# Patient Record
Sex: Female | Born: 1950 | Race: White | Hispanic: No | Marital: Married | Smoking: Never smoker
Health system: Southern US, Community
[De-identification: ages and names within clinical notes are randomized; demographics above are authoritative.]

## PROBLEM LIST (undated history)

## (undated) DIAGNOSIS — I480 Paroxysmal atrial fibrillation: Secondary | ICD-10-CM

## (undated) DIAGNOSIS — R911 Solitary pulmonary nodule: Secondary | ICD-10-CM

## (undated) DIAGNOSIS — E039 Hypothyroidism, unspecified: Secondary | ICD-10-CM

## (undated) DIAGNOSIS — Z8719 Personal history of other diseases of the digestive system: Secondary | ICD-10-CM

## (undated) DIAGNOSIS — M1990 Unspecified osteoarthritis, unspecified site: Secondary | ICD-10-CM

## (undated) DIAGNOSIS — I499 Cardiac arrhythmia, unspecified: Secondary | ICD-10-CM

## (undated) DIAGNOSIS — E785 Hyperlipidemia, unspecified: Secondary | ICD-10-CM

## (undated) DIAGNOSIS — K219 Gastro-esophageal reflux disease without esophagitis: Secondary | ICD-10-CM

## (undated) HISTORY — PX: TONSILLECTOMY: SUR1361

## (undated) HISTORY — PX: KNEE ARTHROSCOPY: SUR90

## (undated) HISTORY — PX: ATRIAL FIBRILLATION ABLATION: SHX5732

## (undated) HISTORY — PX: ANKLE FUSION: SHX881

## (undated) HISTORY — PX: APPENDECTOMY: SHX54

---

## 2003-06-13 ENCOUNTER — Ambulatory Visit (HOSPITAL_COMMUNITY): Admission: RE | Admit: 2003-06-13 | Discharge: 2003-06-13 | Payer: Self-pay | Admitting: Orthopedic Surgery

## 2003-06-13 ENCOUNTER — Ambulatory Visit (HOSPITAL_BASED_OUTPATIENT_CLINIC_OR_DEPARTMENT_OTHER): Admission: RE | Admit: 2003-06-13 | Discharge: 2003-06-13 | Payer: Self-pay | Admitting: Orthopedic Surgery

## 2004-05-16 ENCOUNTER — Other Ambulatory Visit: Admission: RE | Admit: 2004-05-16 | Discharge: 2004-05-16 | Payer: Self-pay | Admitting: *Deleted

## 2015-11-19 DIAGNOSIS — M62830 Muscle spasm of back: Secondary | ICD-10-CM | POA: Diagnosis not present

## 2015-11-19 DIAGNOSIS — M549 Dorsalgia, unspecified: Secondary | ICD-10-CM | POA: Diagnosis not present

## 2015-11-26 DIAGNOSIS — M549 Dorsalgia, unspecified: Secondary | ICD-10-CM | POA: Diagnosis not present

## 2015-11-26 DIAGNOSIS — M4690 Unspecified inflammatory spondylopathy, site unspecified: Secondary | ICD-10-CM | POA: Diagnosis not present

## 2015-12-26 ENCOUNTER — Other Ambulatory Visit (HOSPITAL_COMMUNITY)
Admission: RE | Admit: 2015-12-26 | Discharge: 2015-12-26 | Disposition: A | Discharge status: Home / Self Care | Payer: Medicare Other | Source: Ambulatory Visit | Attending: Internal Medicine | Admitting: Internal Medicine

## 2015-12-26 ENCOUNTER — Other Ambulatory Visit: Payer: Self-pay | Admitting: Registered Nurse

## 2015-12-26 DIAGNOSIS — Z01419 Encounter for gynecological examination (general) (routine) without abnormal findings: Secondary | ICD-10-CM | POA: Diagnosis not present

## 2015-12-28 DIAGNOSIS — Z1211 Encounter for screening for malignant neoplasm of colon: Secondary | ICD-10-CM | POA: Diagnosis not present

## 2015-12-28 DIAGNOSIS — K3189 Other diseases of stomach and duodenum: Secondary | ICD-10-CM | POA: Diagnosis not present

## 2015-12-28 DIAGNOSIS — D127 Benign neoplasm of rectosigmoid junction: Secondary | ICD-10-CM | POA: Diagnosis not present

## 2015-12-28 DIAGNOSIS — K319 Disease of stomach and duodenum, unspecified: Secondary | ICD-10-CM | POA: Diagnosis not present

## 2015-12-31 LAB — CYTOLOGY - PAP

## 2016-02-05 DIAGNOSIS — K210 Gastro-esophageal reflux disease with esophagitis: Secondary | ICD-10-CM | POA: Diagnosis not present

## 2016-02-05 DIAGNOSIS — Z86010 Personal history of colonic polyps: Secondary | ICD-10-CM | POA: Diagnosis not present

## 2016-02-05 DIAGNOSIS — R152 Fecal urgency: Secondary | ICD-10-CM | POA: Diagnosis not present

## 2016-03-27 DIAGNOSIS — E785 Hyperlipidemia, unspecified: Secondary | ICD-10-CM | POA: Diagnosis not present

## 2016-08-22 DIAGNOSIS — M1711 Unilateral primary osteoarthritis, right knee: Secondary | ICD-10-CM | POA: Diagnosis not present

## 2016-08-22 DIAGNOSIS — M1712 Unilateral primary osteoarthritis, left knee: Secondary | ICD-10-CM | POA: Diagnosis not present

## 2016-08-22 DIAGNOSIS — M79671 Pain in right foot: Secondary | ICD-10-CM | POA: Diagnosis not present

## 2016-09-29 DIAGNOSIS — M1712 Unilateral primary osteoarthritis, left knee: Secondary | ICD-10-CM | POA: Diagnosis not present

## 2016-09-29 DIAGNOSIS — M19011 Primary osteoarthritis, right shoulder: Secondary | ICD-10-CM | POA: Diagnosis not present

## 2016-09-29 DIAGNOSIS — M1711 Unilateral primary osteoarthritis, right knee: Secondary | ICD-10-CM | POA: Diagnosis not present

## 2016-10-24 DIAGNOSIS — R05 Cough: Secondary | ICD-10-CM | POA: Diagnosis not present

## 2016-10-24 DIAGNOSIS — R52 Pain, unspecified: Secondary | ICD-10-CM | POA: Diagnosis not present

## 2016-10-26 DIAGNOSIS — K219 Gastro-esophageal reflux disease without esophagitis: Secondary | ICD-10-CM | POA: Diagnosis not present

## 2016-10-26 DIAGNOSIS — R05 Cough: Secondary | ICD-10-CM | POA: Diagnosis not present

## 2016-10-26 DIAGNOSIS — L03115 Cellulitis of right lower limb: Secondary | ICD-10-CM | POA: Diagnosis not present

## 2016-10-28 DIAGNOSIS — L03115 Cellulitis of right lower limb: Secondary | ICD-10-CM | POA: Diagnosis not present

## 2016-10-31 DIAGNOSIS — L03115 Cellulitis of right lower limb: Secondary | ICD-10-CM | POA: Diagnosis not present

## 2016-11-10 DIAGNOSIS — L0390 Cellulitis, unspecified: Secondary | ICD-10-CM | POA: Diagnosis not present

## 2016-11-17 DIAGNOSIS — L0390 Cellulitis, unspecified: Secondary | ICD-10-CM | POA: Diagnosis not present

## 2016-11-25 DIAGNOSIS — E785 Hyperlipidemia, unspecified: Secondary | ICD-10-CM | POA: Diagnosis not present

## 2016-11-25 DIAGNOSIS — Z0000 Encounter for general adult medical examination without abnormal findings: Secondary | ICD-10-CM | POA: Diagnosis not present

## 2016-11-25 DIAGNOSIS — R739 Hyperglycemia, unspecified: Secondary | ICD-10-CM | POA: Diagnosis not present

## 2016-12-02 DIAGNOSIS — E7800 Pure hypercholesterolemia, unspecified: Secondary | ICD-10-CM | POA: Diagnosis not present

## 2016-12-02 DIAGNOSIS — C44310 Basal cell carcinoma of skin of unspecified parts of face: Secondary | ICD-10-CM | POA: Diagnosis not present

## 2016-12-02 DIAGNOSIS — Z0000 Encounter for general adult medical examination without abnormal findings: Secondary | ICD-10-CM | POA: Diagnosis not present

## 2016-12-02 DIAGNOSIS — L03115 Cellulitis of right lower limb: Secondary | ICD-10-CM | POA: Diagnosis not present

## 2016-12-03 ENCOUNTER — Other Ambulatory Visit: Payer: Self-pay | Admitting: Internal Medicine

## 2016-12-03 DIAGNOSIS — R1011 Right upper quadrant pain: Secondary | ICD-10-CM

## 2016-12-05 ENCOUNTER — Ambulatory Visit
Admission: RE | Admit: 2016-12-05 | Discharge: 2016-12-05 | Disposition: A | Discharge status: Home / Self Care | Payer: Federal, State, Local not specified - PPO | Source: Ambulatory Visit | Attending: Internal Medicine | Admitting: Internal Medicine

## 2016-12-05 ENCOUNTER — Other Ambulatory Visit: Payer: Self-pay | Admitting: Internal Medicine

## 2016-12-05 ENCOUNTER — Ambulatory Visit
Admission: RE | Admit: 2016-12-05 | Discharge: 2016-12-05 | Disposition: A | Discharge status: Home / Self Care | Payer: Medicare Other | Source: Ambulatory Visit | Attending: Internal Medicine | Admitting: Internal Medicine

## 2016-12-05 DIAGNOSIS — R1011 Right upper quadrant pain: Secondary | ICD-10-CM

## 2016-12-05 DIAGNOSIS — D259 Leiomyoma of uterus, unspecified: Secondary | ICD-10-CM | POA: Diagnosis not present

## 2016-12-05 MED ORDER — IOPAMIDOL (ISOVUE-300) INJECTION 61%
100.0000 mL | Freq: Once | INTRAVENOUS | Status: AC | PRN
  Administered 2016-12-05: 100 mL via INTRAVENOUS

## 2016-12-19 DIAGNOSIS — L810 Postinflammatory hyperpigmentation: Secondary | ICD-10-CM | POA: Diagnosis not present

## 2016-12-19 DIAGNOSIS — C44319 Basal cell carcinoma of skin of other parts of face: Secondary | ICD-10-CM | POA: Diagnosis not present

## 2016-12-25 DIAGNOSIS — R1011 Right upper quadrant pain: Secondary | ICD-10-CM | POA: Diagnosis not present

## 2017-02-25 DIAGNOSIS — C44319 Basal cell carcinoma of skin of other parts of face: Secondary | ICD-10-CM | POA: Diagnosis not present

## 2017-05-20 DIAGNOSIS — M1712 Unilateral primary osteoarthritis, left knee: Secondary | ICD-10-CM | POA: Diagnosis not present

## 2017-05-20 DIAGNOSIS — M25561 Pain in right knee: Secondary | ICD-10-CM | POA: Diagnosis not present

## 2017-05-20 DIAGNOSIS — M25562 Pain in left knee: Secondary | ICD-10-CM | POA: Diagnosis not present

## 2017-05-20 DIAGNOSIS — M1711 Unilateral primary osteoarthritis, right knee: Secondary | ICD-10-CM | POA: Diagnosis not present

## 2017-05-21 DIAGNOSIS — E7800 Pure hypercholesterolemia, unspecified: Secondary | ICD-10-CM | POA: Diagnosis not present

## 2017-06-04 DIAGNOSIS — E7800 Pure hypercholesterolemia, unspecified: Secondary | ICD-10-CM | POA: Diagnosis not present

## 2017-06-04 DIAGNOSIS — R739 Hyperglycemia, unspecified: Secondary | ICD-10-CM | POA: Diagnosis not present

## 2017-09-02 DIAGNOSIS — R739 Hyperglycemia, unspecified: Secondary | ICD-10-CM | POA: Diagnosis not present

## 2017-09-02 DIAGNOSIS — E7800 Pure hypercholesterolemia, unspecified: Secondary | ICD-10-CM | POA: Diagnosis not present

## 2017-09-07 DIAGNOSIS — R739 Hyperglycemia, unspecified: Secondary | ICD-10-CM | POA: Diagnosis not present

## 2017-09-07 DIAGNOSIS — E7800 Pure hypercholesterolemia, unspecified: Secondary | ICD-10-CM | POA: Diagnosis not present

## 2018-03-24 DIAGNOSIS — E7800 Pure hypercholesterolemia, unspecified: Secondary | ICD-10-CM | POA: Diagnosis not present

## 2018-03-24 DIAGNOSIS — R739 Hyperglycemia, unspecified: Secondary | ICD-10-CM | POA: Diagnosis not present

## 2018-04-05 DIAGNOSIS — E7800 Pure hypercholesterolemia, unspecified: Secondary | ICD-10-CM | POA: Diagnosis not present

## 2018-04-05 DIAGNOSIS — R739 Hyperglycemia, unspecified: Secondary | ICD-10-CM | POA: Diagnosis not present

## 2018-06-24 DIAGNOSIS — Z0000 Encounter for general adult medical examination without abnormal findings: Secondary | ICD-10-CM | POA: Diagnosis not present

## 2018-06-24 DIAGNOSIS — E7800 Pure hypercholesterolemia, unspecified: Secondary | ICD-10-CM | POA: Diagnosis not present

## 2018-06-24 DIAGNOSIS — R739 Hyperglycemia, unspecified: Secondary | ICD-10-CM | POA: Diagnosis not present

## 2018-07-14 DIAGNOSIS — Z1212 Encounter for screening for malignant neoplasm of rectum: Secondary | ICD-10-CM | POA: Diagnosis not present

## 2018-07-14 DIAGNOSIS — K219 Gastro-esophageal reflux disease without esophagitis: Secondary | ICD-10-CM | POA: Diagnosis not present

## 2018-07-14 DIAGNOSIS — Z0000 Encounter for general adult medical examination without abnormal findings: Secondary | ICD-10-CM | POA: Diagnosis not present

## 2018-07-14 DIAGNOSIS — E875 Hyperkalemia: Secondary | ICD-10-CM | POA: Diagnosis not present

## 2019-02-04 IMAGING — CT CT ABD-PELV W/ CM
1 of 3 series · 14 of 32 positions shown, 19 images · IV contrast (APPLIED)
Comparison: None.

CLINICAL DATA: Right-sided abdominal pain and tenderness for 1
month. Previous appendectomy.

Creatinine was obtained on site at [HOSPITAL] at [HOSPITAL].Results: Creatinine 0.8 mg/dL.
EXAM:
CT ABDOMEN AND PELVIS WITH CONTRAST
TECHNIQUE: Multidetector CT imaging of the abdomen and pelvis was performed
using the standard protocol following bolus administration of
intravenous contrast.
CONTRAST:  100mL URT83G-WDD IOPAMIDOL (URT83G-WDD) INJECTION 61%

[Series 2: abd/pelvis w/cm · axial · 0.78mm/px · z∈[-399,-4]mm · 14 of 89 slices shown, 19 images]
[im 5/89  soft-tissue]
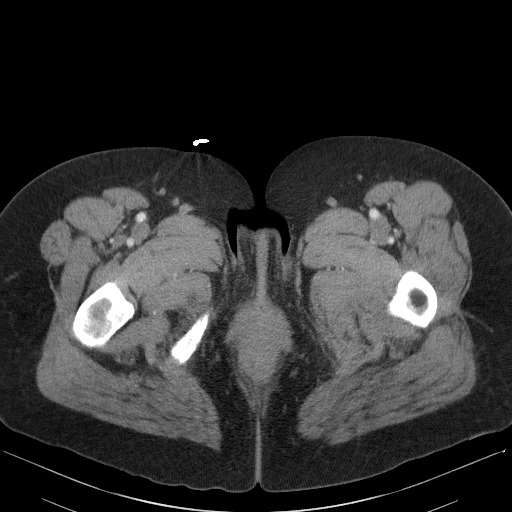
[im 5/89  bone]
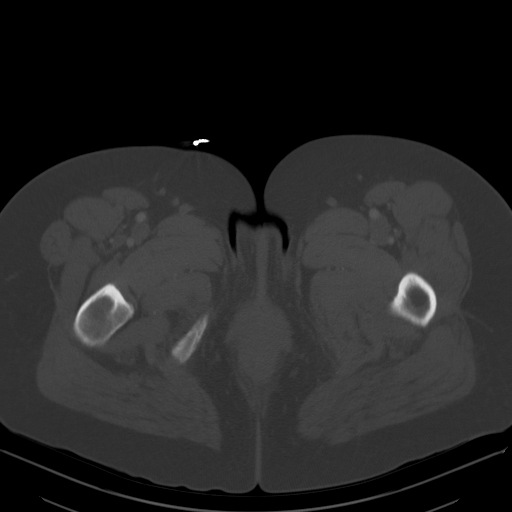
[im 10/89  soft-tissue]
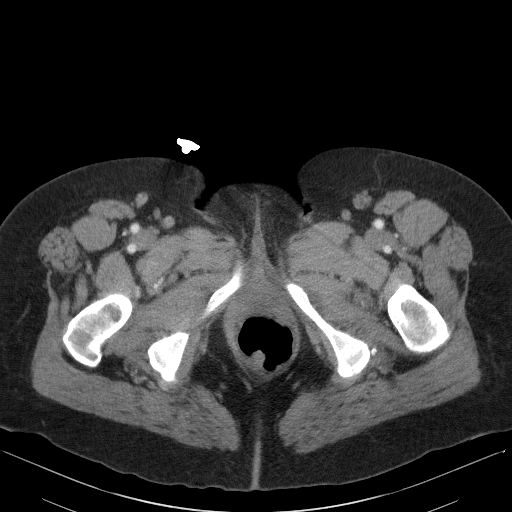
[im 20/89  soft-tissue]
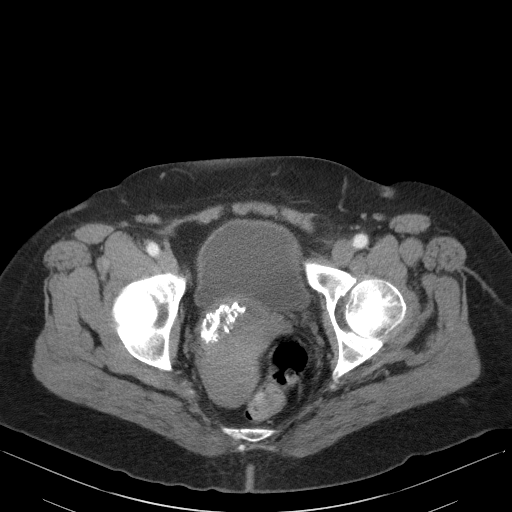
[im 25/89  soft-tissue]
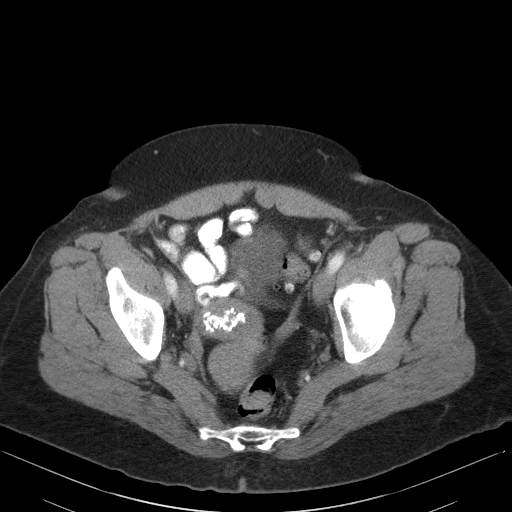
[im 30/89  soft-tissue]
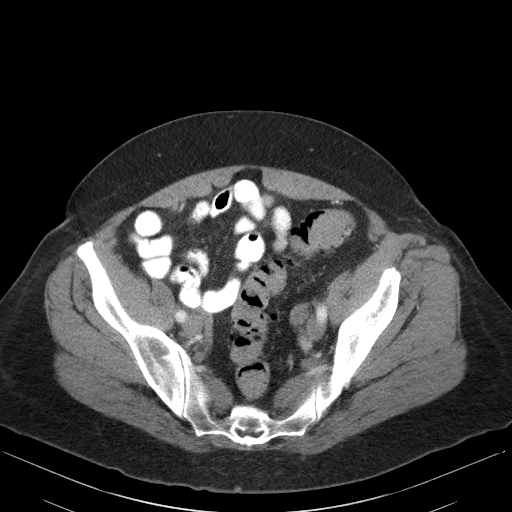
[im 40/89  soft-tissue]
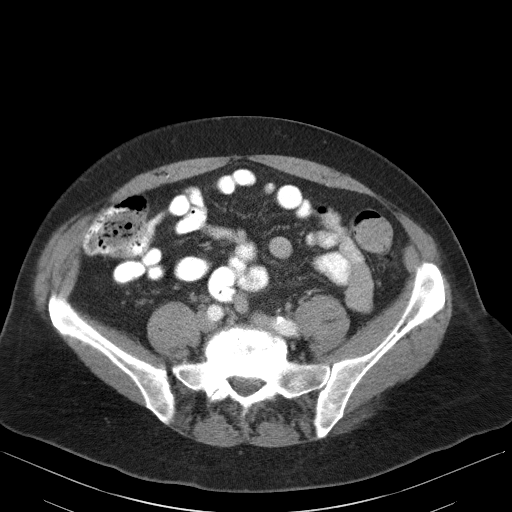
[im 45/89  soft-tissue]
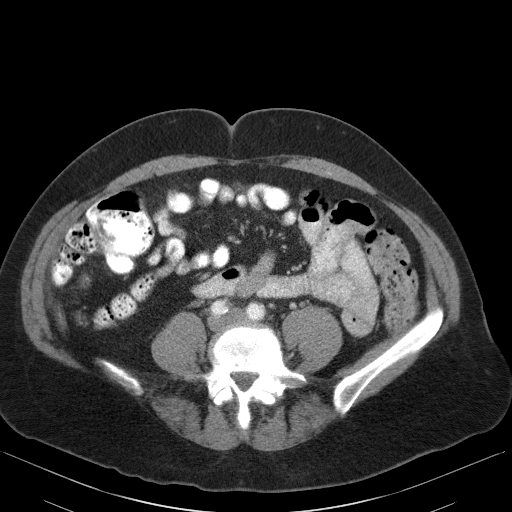
[im 49/89  soft-tissue]
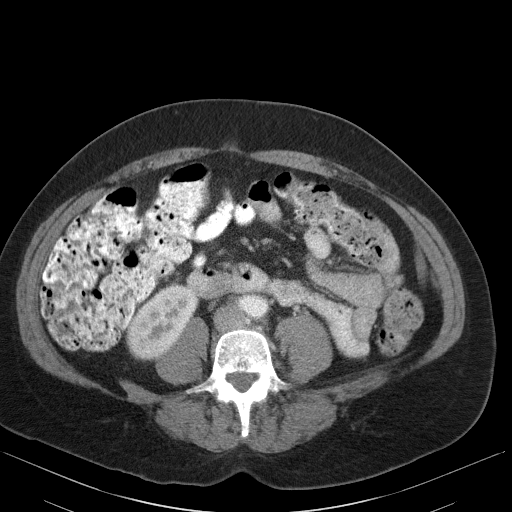
[im 59/89  soft-tissue]
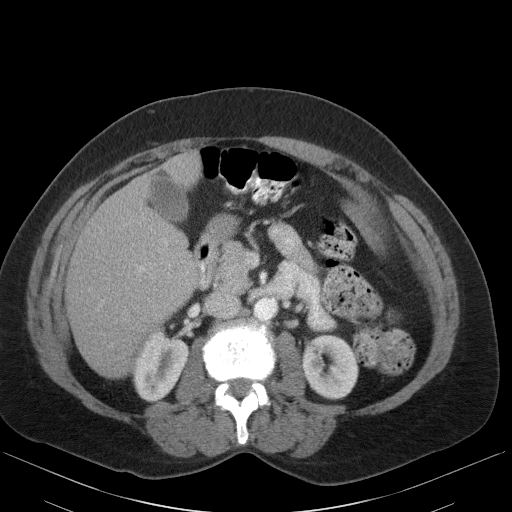
[im 59/89  bone]
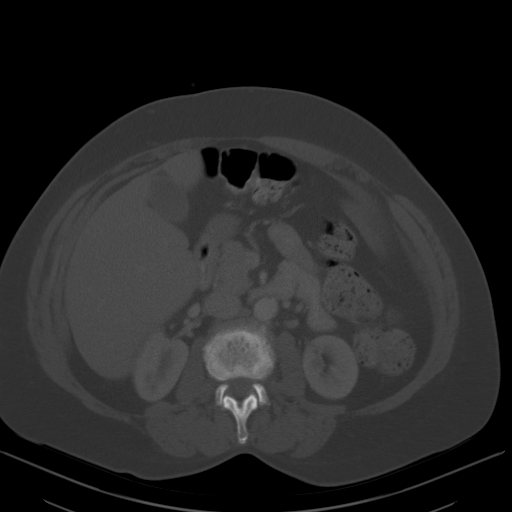
[im 64/89  soft-tissue]
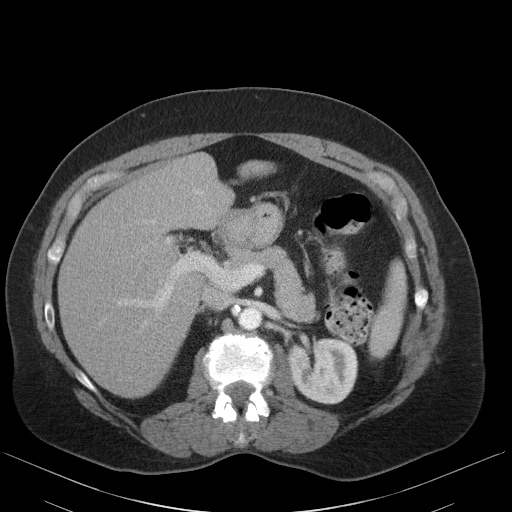
[im 69/89  soft-tissue]
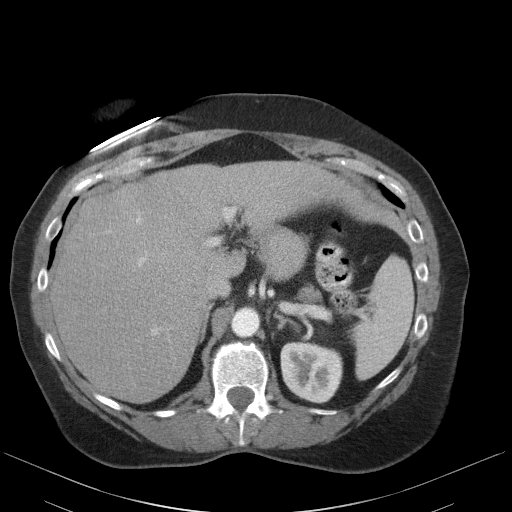
[im 69/89  lung]
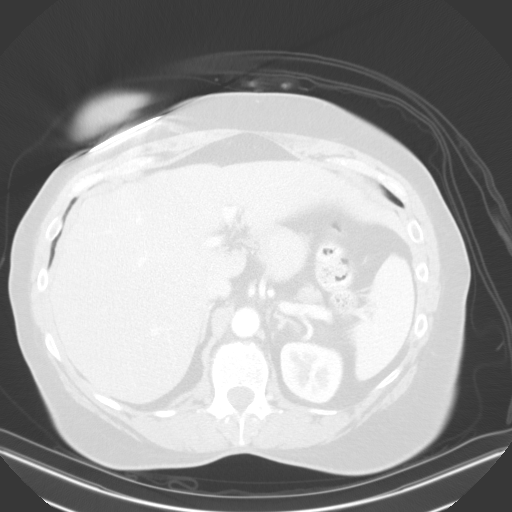
[im 74/89  lung]
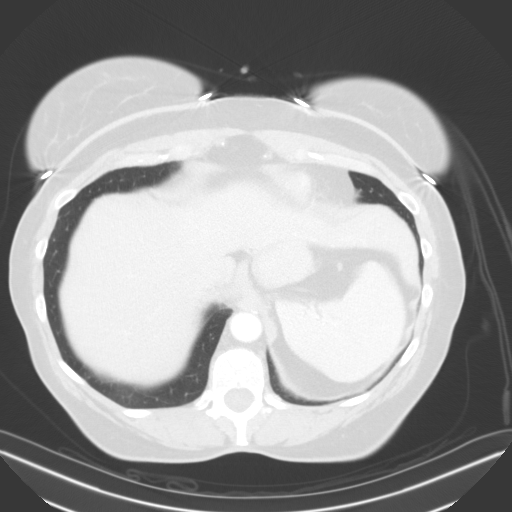
[im 79/89  soft-tissue]
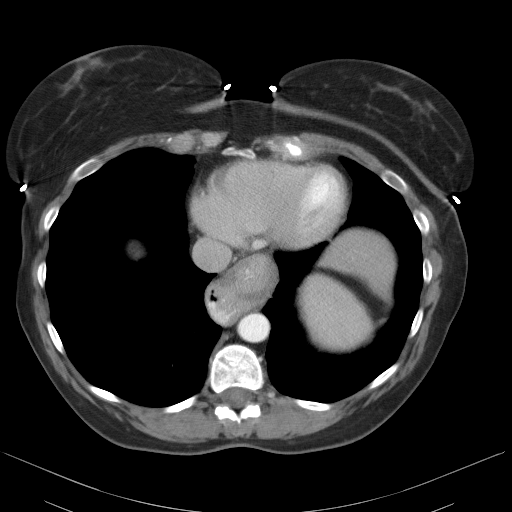
[im 79/89  lung]
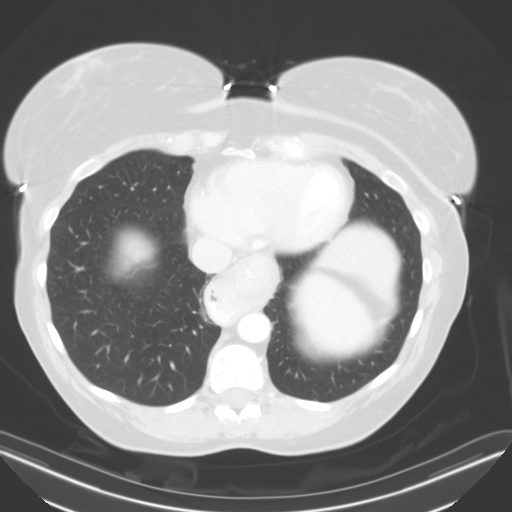
[im 84/89  soft-tissue]
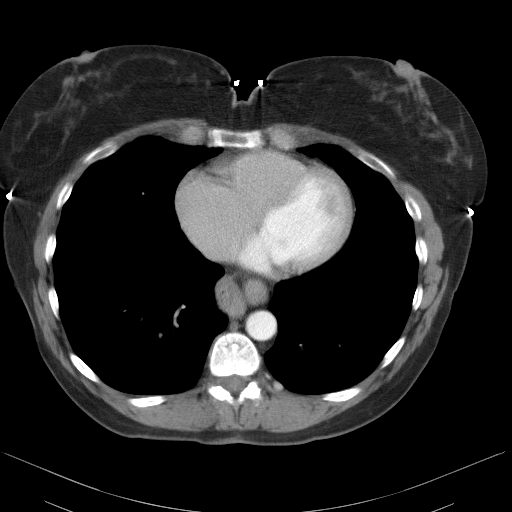
[im 84/89  lung]
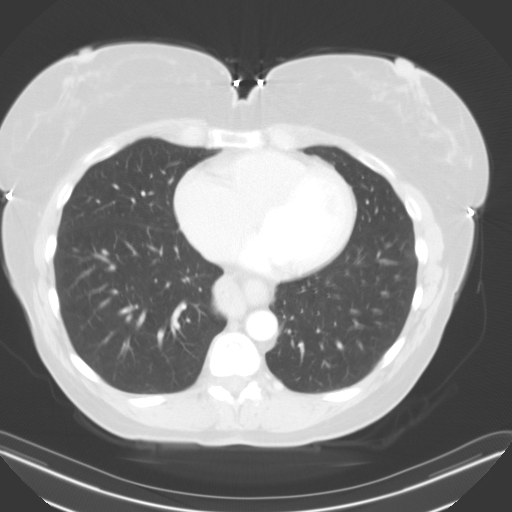

[14 of 32 positions shown; findings below may reference images not displayed]

FINDINGS: Lower Chest: No acute findings.

Hepatobiliary:  No masses identified. Gallbladder is unremarkable.

Pancreas:  No mass or inflammatory changes.

Spleen: Within normal limits in size and appearance.

Adrenals/Urinary Tract: No masses identified. No evidence of
hydronephrosis.

Stomach/Bowel: Small to moderate size hiatal hernia. No evidence of
obstruction, inflammatory process or abnormal fluid collections.
Mild sigmoid diverticulosis noted, without evidence of
diverticulitis. Large colonic stool burden also noted.

Vascular/Lymphatic: No pathologically enlarged lymph nodes. No
abdominal aortic aneurysm. Aortic atherosclerosis.

Reproductive: Uterine fibroids are noted, largest seen anteriorly
which is partially calcified and measures approximately 6 cm in
diameter. Adnexal regions are unremarkable.

Other:  None.

Musculoskeletal:  No suspicious bone lesions identified.
IMPRESSION: No acute findings within the abdomen or pelvis.

Uterine fibroids, largest measuring approximately 6 cm.

Small to moderate hiatal hernia.

Colonic diverticulosis. No radiographic evidence of diverticulitis.

Large stool burden noted; recommend clinical correlation for
possible constipation.

## 2019-07-14 DIAGNOSIS — I10 Essential (primary) hypertension: Secondary | ICD-10-CM | POA: Diagnosis not present

## 2019-07-14 DIAGNOSIS — E7800 Pure hypercholesterolemia, unspecified: Secondary | ICD-10-CM | POA: Diagnosis not present

## 2019-07-14 DIAGNOSIS — E039 Hypothyroidism, unspecified: Secondary | ICD-10-CM | POA: Diagnosis not present

## 2019-07-14 DIAGNOSIS — Z0000 Encounter for general adult medical examination without abnormal findings: Secondary | ICD-10-CM | POA: Diagnosis not present

## 2019-07-14 DIAGNOSIS — R739 Hyperglycemia, unspecified: Secondary | ICD-10-CM | POA: Diagnosis not present

## 2019-07-18 DIAGNOSIS — K219 Gastro-esophageal reflux disease without esophagitis: Secondary | ICD-10-CM | POA: Diagnosis not present

## 2019-07-18 DIAGNOSIS — E7800 Pure hypercholesterolemia, unspecified: Secondary | ICD-10-CM | POA: Diagnosis not present

## 2019-07-18 DIAGNOSIS — E039 Hypothyroidism, unspecified: Secondary | ICD-10-CM | POA: Diagnosis not present

## 2019-07-18 DIAGNOSIS — Z0000 Encounter for general adult medical examination without abnormal findings: Secondary | ICD-10-CM | POA: Diagnosis not present

## 2020-01-18 DIAGNOSIS — E039 Hypothyroidism, unspecified: Secondary | ICD-10-CM | POA: Diagnosis not present

## 2020-01-18 DIAGNOSIS — E7800 Pure hypercholesterolemia, unspecified: Secondary | ICD-10-CM | POA: Diagnosis not present

## 2020-01-25 DIAGNOSIS — E7800 Pure hypercholesterolemia, unspecified: Secondary | ICD-10-CM | POA: Diagnosis not present

## 2020-01-25 DIAGNOSIS — R7989 Other specified abnormal findings of blood chemistry: Secondary | ICD-10-CM | POA: Diagnosis not present

## 2020-01-25 DIAGNOSIS — E875 Hyperkalemia: Secondary | ICD-10-CM | POA: Diagnosis not present

## 2020-01-25 DIAGNOSIS — E039 Hypothyroidism, unspecified: Secondary | ICD-10-CM | POA: Diagnosis not present

## 2020-02-14 DIAGNOSIS — B079 Viral wart, unspecified: Secondary | ICD-10-CM | POA: Diagnosis not present

## 2020-02-14 DIAGNOSIS — L820 Inflamed seborrheic keratosis: Secondary | ICD-10-CM | POA: Diagnosis not present

## 2020-02-14 DIAGNOSIS — D485 Neoplasm of uncertain behavior of skin: Secondary | ICD-10-CM | POA: Diagnosis not present

## 2020-03-20 DIAGNOSIS — Z20822 Contact with and (suspected) exposure to covid-19: Secondary | ICD-10-CM | POA: Diagnosis not present

## 2020-04-04 DIAGNOSIS — E7800 Pure hypercholesterolemia, unspecified: Secondary | ICD-10-CM | POA: Diagnosis not present

## 2020-04-04 DIAGNOSIS — E039 Hypothyroidism, unspecified: Secondary | ICD-10-CM | POA: Diagnosis not present

## 2020-04-12 DIAGNOSIS — E039 Hypothyroidism, unspecified: Secondary | ICD-10-CM | POA: Diagnosis not present

## 2020-04-12 DIAGNOSIS — Z23 Encounter for immunization: Secondary | ICD-10-CM | POA: Diagnosis not present

## 2020-04-12 DIAGNOSIS — E7800 Pure hypercholesterolemia, unspecified: Secondary | ICD-10-CM | POA: Diagnosis not present

## 2020-06-08 DIAGNOSIS — M1712 Unilateral primary osteoarthritis, left knee: Secondary | ICD-10-CM | POA: Diagnosis not present

## 2020-06-08 DIAGNOSIS — M1711 Unilateral primary osteoarthritis, right knee: Secondary | ICD-10-CM | POA: Diagnosis not present

## 2020-06-08 DIAGNOSIS — M25571 Pain in right ankle and joints of right foot: Secondary | ICD-10-CM | POA: Diagnosis not present

## 2020-07-07 HISTORY — PX: COLONOSCOPY WITH ESOPHAGOGASTRODUODENOSCOPY (EGD): SHX5779

## 2020-09-05 DIAGNOSIS — R739 Hyperglycemia, unspecified: Secondary | ICD-10-CM | POA: Diagnosis not present

## 2020-09-05 DIAGNOSIS — R7989 Other specified abnormal findings of blood chemistry: Secondary | ICD-10-CM | POA: Diagnosis not present

## 2020-09-05 DIAGNOSIS — E785 Hyperlipidemia, unspecified: Secondary | ICD-10-CM | POA: Diagnosis not present

## 2020-09-05 DIAGNOSIS — E039 Hypothyroidism, unspecified: Secondary | ICD-10-CM | POA: Diagnosis not present

## 2020-09-05 DIAGNOSIS — E7800 Pure hypercholesterolemia, unspecified: Secondary | ICD-10-CM | POA: Diagnosis not present

## 2020-09-05 DIAGNOSIS — Z0000 Encounter for general adult medical examination without abnormal findings: Secondary | ICD-10-CM | POA: Diagnosis not present

## 2020-09-10 DIAGNOSIS — E7800 Pure hypercholesterolemia, unspecified: Secondary | ICD-10-CM | POA: Diagnosis not present

## 2020-09-10 DIAGNOSIS — E039 Hypothyroidism, unspecified: Secondary | ICD-10-CM | POA: Diagnosis not present

## 2020-09-10 DIAGNOSIS — Z0000 Encounter for general adult medical examination without abnormal findings: Secondary | ICD-10-CM | POA: Diagnosis not present

## 2020-09-10 DIAGNOSIS — K219 Gastro-esophageal reflux disease without esophagitis: Secondary | ICD-10-CM | POA: Diagnosis not present

## 2020-09-27 DIAGNOSIS — I4891 Unspecified atrial fibrillation: Secondary | ICD-10-CM | POA: Diagnosis not present

## 2020-09-27 DIAGNOSIS — E785 Hyperlipidemia, unspecified: Secondary | ICD-10-CM | POA: Diagnosis not present

## 2020-09-27 DIAGNOSIS — Z79899 Other long term (current) drug therapy: Secondary | ICD-10-CM | POA: Diagnosis not present

## 2020-09-27 DIAGNOSIS — R935 Abnormal findings on diagnostic imaging of other abdominal regions, including retroperitoneum: Secondary | ICD-10-CM | POA: Diagnosis not present

## 2020-09-27 DIAGNOSIS — I480 Paroxysmal atrial fibrillation: Secondary | ICD-10-CM | POA: Diagnosis not present

## 2020-09-27 DIAGNOSIS — I493 Ventricular premature depolarization: Secondary | ICD-10-CM | POA: Diagnosis not present

## 2020-09-27 DIAGNOSIS — E669 Obesity, unspecified: Secondary | ICD-10-CM | POA: Diagnosis not present

## 2020-09-27 DIAGNOSIS — I252 Old myocardial infarction: Secondary | ICD-10-CM | POA: Diagnosis not present

## 2020-09-27 DIAGNOSIS — E039 Hypothyroidism, unspecified: Secondary | ICD-10-CM | POA: Diagnosis not present

## 2020-09-27 DIAGNOSIS — K219 Gastro-esophageal reflux disease without esophagitis: Secondary | ICD-10-CM | POA: Diagnosis not present

## 2020-09-27 DIAGNOSIS — I083 Combined rheumatic disorders of mitral, aortic and tricuspid valves: Secondary | ICD-10-CM | POA: Diagnosis not present

## 2020-09-27 DIAGNOSIS — Z79890 Hormone replacement therapy (postmenopausal): Secondary | ICD-10-CM | POA: Diagnosis not present

## 2020-09-27 DIAGNOSIS — K449 Diaphragmatic hernia without obstruction or gangrene: Secondary | ICD-10-CM | POA: Diagnosis not present

## 2020-09-27 DIAGNOSIS — E7800 Pure hypercholesterolemia, unspecified: Secondary | ICD-10-CM | POA: Diagnosis not present

## 2020-09-27 DIAGNOSIS — R002 Palpitations: Secondary | ICD-10-CM | POA: Diagnosis not present

## 2020-09-27 DIAGNOSIS — Z6832 Body mass index (BMI) 32.0-32.9, adult: Secondary | ICD-10-CM | POA: Diagnosis not present

## 2020-09-27 DIAGNOSIS — R001 Bradycardia, unspecified: Secondary | ICD-10-CM | POA: Diagnosis not present

## 2020-09-28 DIAGNOSIS — E7800 Pure hypercholesterolemia, unspecified: Secondary | ICD-10-CM | POA: Diagnosis not present

## 2020-09-28 DIAGNOSIS — E785 Hyperlipidemia, unspecified: Secondary | ICD-10-CM | POA: Diagnosis not present

## 2020-09-28 DIAGNOSIS — E669 Obesity, unspecified: Secondary | ICD-10-CM | POA: Diagnosis not present

## 2020-09-28 DIAGNOSIS — K219 Gastro-esophageal reflux disease without esophagitis: Secondary | ICD-10-CM | POA: Diagnosis not present

## 2020-09-28 DIAGNOSIS — R001 Bradycardia, unspecified: Secondary | ICD-10-CM | POA: Diagnosis not present

## 2020-09-28 DIAGNOSIS — Z6832 Body mass index (BMI) 32.0-32.9, adult: Secondary | ICD-10-CM | POA: Diagnosis not present

## 2020-09-28 DIAGNOSIS — E039 Hypothyroidism, unspecified: Secondary | ICD-10-CM | POA: Diagnosis not present

## 2020-09-28 DIAGNOSIS — I4891 Unspecified atrial fibrillation: Secondary | ICD-10-CM | POA: Diagnosis not present

## 2020-09-29 DIAGNOSIS — K219 Gastro-esophageal reflux disease without esophagitis: Secondary | ICD-10-CM | POA: Diagnosis not present

## 2020-09-29 DIAGNOSIS — E7800 Pure hypercholesterolemia, unspecified: Secondary | ICD-10-CM | POA: Diagnosis not present

## 2020-09-29 DIAGNOSIS — R001 Bradycardia, unspecified: Secondary | ICD-10-CM | POA: Diagnosis not present

## 2020-09-29 DIAGNOSIS — I480 Paroxysmal atrial fibrillation: Secondary | ICD-10-CM | POA: Diagnosis not present

## 2020-09-29 DIAGNOSIS — I4891 Unspecified atrial fibrillation: Secondary | ICD-10-CM | POA: Diagnosis not present

## 2020-09-29 DIAGNOSIS — E039 Hypothyroidism, unspecified: Secondary | ICD-10-CM | POA: Diagnosis not present

## 2020-10-01 DIAGNOSIS — R001 Bradycardia, unspecified: Secondary | ICD-10-CM | POA: Diagnosis not present

## 2020-10-24 DIAGNOSIS — E7800 Pure hypercholesterolemia, unspecified: Secondary | ICD-10-CM | POA: Diagnosis not present

## 2020-10-24 DIAGNOSIS — K449 Diaphragmatic hernia without obstruction or gangrene: Secondary | ICD-10-CM | POA: Diagnosis not present

## 2020-10-24 DIAGNOSIS — Z7901 Long term (current) use of anticoagulants: Secondary | ICD-10-CM | POA: Diagnosis not present

## 2020-10-24 DIAGNOSIS — I4891 Unspecified atrial fibrillation: Secondary | ICD-10-CM | POA: Diagnosis not present

## 2020-10-29 DIAGNOSIS — I480 Paroxysmal atrial fibrillation: Secondary | ICD-10-CM | POA: Diagnosis not present

## 2020-10-30 DIAGNOSIS — I4891 Unspecified atrial fibrillation: Secondary | ICD-10-CM | POA: Diagnosis not present

## 2020-10-30 DIAGNOSIS — I471 Supraventricular tachycardia: Secondary | ICD-10-CM | POA: Diagnosis not present

## 2020-11-20 DIAGNOSIS — E782 Mixed hyperlipidemia: Secondary | ICD-10-CM | POA: Diagnosis not present

## 2020-11-20 DIAGNOSIS — E6609 Other obesity due to excess calories: Secondary | ICD-10-CM | POA: Diagnosis not present

## 2020-11-20 DIAGNOSIS — E039 Hypothyroidism, unspecified: Secondary | ICD-10-CM | POA: Diagnosis not present

## 2020-11-20 DIAGNOSIS — Z6831 Body mass index (BMI) 31.0-31.9, adult: Secondary | ICD-10-CM | POA: Diagnosis not present

## 2020-11-20 DIAGNOSIS — I480 Paroxysmal atrial fibrillation: Secondary | ICD-10-CM | POA: Diagnosis not present

## 2021-03-13 DIAGNOSIS — E039 Hypothyroidism, unspecified: Secondary | ICD-10-CM | POA: Diagnosis not present

## 2021-03-13 DIAGNOSIS — E7800 Pure hypercholesterolemia, unspecified: Secondary | ICD-10-CM | POA: Diagnosis not present

## 2021-04-12 DIAGNOSIS — E875 Hyperkalemia: Secondary | ICD-10-CM | POA: Diagnosis not present

## 2021-09-13 DIAGNOSIS — R739 Hyperglycemia, unspecified: Secondary | ICD-10-CM | POA: Diagnosis not present

## 2021-09-13 DIAGNOSIS — E7800 Pure hypercholesterolemia, unspecified: Secondary | ICD-10-CM | POA: Diagnosis not present

## 2021-09-13 DIAGNOSIS — Z0000 Encounter for general adult medical examination without abnormal findings: Secondary | ICD-10-CM | POA: Diagnosis not present

## 2021-09-13 DIAGNOSIS — E039 Hypothyroidism, unspecified: Secondary | ICD-10-CM | POA: Diagnosis not present

## 2021-09-13 DIAGNOSIS — I10 Essential (primary) hypertension: Secondary | ICD-10-CM | POA: Diagnosis not present

## 2021-09-18 DIAGNOSIS — E039 Hypothyroidism, unspecified: Secondary | ICD-10-CM | POA: Diagnosis not present

## 2021-09-18 DIAGNOSIS — K219 Gastro-esophageal reflux disease without esophagitis: Secondary | ICD-10-CM | POA: Diagnosis not present

## 2021-09-18 DIAGNOSIS — I4891 Unspecified atrial fibrillation: Secondary | ICD-10-CM | POA: Diagnosis not present

## 2021-09-18 DIAGNOSIS — Z7901 Long term (current) use of anticoagulants: Secondary | ICD-10-CM | POA: Diagnosis not present

## 2021-09-18 DIAGNOSIS — Z0000 Encounter for general adult medical examination without abnormal findings: Secondary | ICD-10-CM | POA: Diagnosis not present

## 2021-11-19 DIAGNOSIS — Z1211 Encounter for screening for malignant neoplasm of colon: Secondary | ICD-10-CM | POA: Diagnosis not present

## 2021-11-19 DIAGNOSIS — Z800 Family history of malignant neoplasm of digestive organs: Secondary | ICD-10-CM | POA: Diagnosis not present

## 2021-11-19 DIAGNOSIS — E782 Mixed hyperlipidemia: Secondary | ICD-10-CM | POA: Diagnosis not present

## 2021-11-19 DIAGNOSIS — K219 Gastro-esophageal reflux disease without esophagitis: Secondary | ICD-10-CM | POA: Diagnosis not present

## 2021-11-19 DIAGNOSIS — I480 Paroxysmal atrial fibrillation: Secondary | ICD-10-CM | POA: Diagnosis not present

## 2021-11-19 DIAGNOSIS — Z86010 Personal history of colonic polyps: Secondary | ICD-10-CM | POA: Diagnosis not present

## 2021-12-24 DIAGNOSIS — Z6827 Body mass index (BMI) 27.0-27.9, adult: Secondary | ICD-10-CM | POA: Diagnosis not present

## 2021-12-24 DIAGNOSIS — E785 Hyperlipidemia, unspecified: Secondary | ICD-10-CM | POA: Diagnosis not present

## 2021-12-24 DIAGNOSIS — I4891 Unspecified atrial fibrillation: Secondary | ICD-10-CM | POA: Diagnosis not present

## 2021-12-24 DIAGNOSIS — E663 Overweight: Secondary | ICD-10-CM | POA: Diagnosis not present

## 2021-12-24 DIAGNOSIS — Z7901 Long term (current) use of anticoagulants: Secondary | ICD-10-CM | POA: Diagnosis not present

## 2022-01-06 DIAGNOSIS — Z7901 Long term (current) use of anticoagulants: Secondary | ICD-10-CM | POA: Diagnosis not present

## 2022-01-06 DIAGNOSIS — I480 Paroxysmal atrial fibrillation: Secondary | ICD-10-CM | POA: Diagnosis not present

## 2022-01-08 DIAGNOSIS — R001 Bradycardia, unspecified: Secondary | ICD-10-CM | POA: Diagnosis not present

## 2022-01-08 DIAGNOSIS — I480 Paroxysmal atrial fibrillation: Secondary | ICD-10-CM | POA: Diagnosis not present

## 2022-01-22 DIAGNOSIS — K649 Unspecified hemorrhoids: Secondary | ICD-10-CM | POA: Diagnosis not present

## 2022-01-22 DIAGNOSIS — Z1211 Encounter for screening for malignant neoplasm of colon: Secondary | ICD-10-CM | POA: Diagnosis not present

## 2022-01-22 DIAGNOSIS — Z800 Family history of malignant neoplasm of digestive organs: Secondary | ICD-10-CM | POA: Diagnosis not present

## 2022-01-22 DIAGNOSIS — K5730 Diverticulosis of large intestine without perforation or abscess without bleeding: Secondary | ICD-10-CM | POA: Diagnosis not present

## 2022-01-22 DIAGNOSIS — K635 Polyp of colon: Secondary | ICD-10-CM | POA: Diagnosis not present

## 2022-01-22 DIAGNOSIS — D125 Benign neoplasm of sigmoid colon: Secondary | ICD-10-CM | POA: Diagnosis not present

## 2022-02-07 DIAGNOSIS — M19071 Primary osteoarthritis, right ankle and foot: Secondary | ICD-10-CM | POA: Diagnosis not present

## 2022-03-12 DIAGNOSIS — M19071 Primary osteoarthritis, right ankle and foot: Secondary | ICD-10-CM | POA: Diagnosis not present

## 2022-04-22 DIAGNOSIS — R918 Other nonspecific abnormal finding of lung field: Secondary | ICD-10-CM | POA: Diagnosis not present

## 2022-04-22 DIAGNOSIS — I480 Paroxysmal atrial fibrillation: Secondary | ICD-10-CM | POA: Diagnosis not present

## 2022-04-22 DIAGNOSIS — I4891 Unspecified atrial fibrillation: Secondary | ICD-10-CM | POA: Diagnosis not present

## 2022-04-22 DIAGNOSIS — K449 Diaphragmatic hernia without obstruction or gangrene: Secondary | ICD-10-CM | POA: Diagnosis not present

## 2022-04-22 DIAGNOSIS — I517 Cardiomegaly: Secondary | ICD-10-CM | POA: Diagnosis not present

## 2022-04-22 DIAGNOSIS — I071 Rheumatic tricuspid insufficiency: Secondary | ICD-10-CM | POA: Diagnosis not present

## 2022-04-23 DIAGNOSIS — Z79899 Other long term (current) drug therapy: Secondary | ICD-10-CM | POA: Diagnosis not present

## 2022-04-23 DIAGNOSIS — E782 Mixed hyperlipidemia: Secondary | ICD-10-CM | POA: Diagnosis not present

## 2022-04-23 DIAGNOSIS — I480 Paroxysmal atrial fibrillation: Secondary | ICD-10-CM | POA: Diagnosis not present

## 2022-04-23 DIAGNOSIS — E039 Hypothyroidism, unspecified: Secondary | ICD-10-CM | POA: Diagnosis not present

## 2022-04-23 DIAGNOSIS — Z7901 Long term (current) use of anticoagulants: Secondary | ICD-10-CM | POA: Diagnosis not present

## 2022-04-24 DIAGNOSIS — Z79899 Other long term (current) drug therapy: Secondary | ICD-10-CM | POA: Diagnosis not present

## 2022-04-24 DIAGNOSIS — I480 Paroxysmal atrial fibrillation: Secondary | ICD-10-CM | POA: Diagnosis not present

## 2022-04-24 DIAGNOSIS — E782 Mixed hyperlipidemia: Secondary | ICD-10-CM | POA: Diagnosis not present

## 2022-04-24 DIAGNOSIS — Z7901 Long term (current) use of anticoagulants: Secondary | ICD-10-CM | POA: Diagnosis not present

## 2022-04-24 DIAGNOSIS — E039 Hypothyroidism, unspecified: Secondary | ICD-10-CM | POA: Diagnosis not present

## 2022-06-10 DIAGNOSIS — I480 Paroxysmal atrial fibrillation: Secondary | ICD-10-CM | POA: Diagnosis not present

## 2022-06-10 DIAGNOSIS — Z7901 Long term (current) use of anticoagulants: Secondary | ICD-10-CM | POA: Diagnosis not present

## 2022-06-10 DIAGNOSIS — E039 Hypothyroidism, unspecified: Secondary | ICD-10-CM | POA: Diagnosis not present

## 2022-06-10 DIAGNOSIS — E7849 Other hyperlipidemia: Secondary | ICD-10-CM | POA: Diagnosis not present

## 2022-09-12 DIAGNOSIS — Z9889 Other specified postprocedural states: Secondary | ICD-10-CM | POA: Diagnosis not present

## 2022-09-12 DIAGNOSIS — I480 Paroxysmal atrial fibrillation: Secondary | ICD-10-CM | POA: Diagnosis not present

## 2022-09-18 DIAGNOSIS — E039 Hypothyroidism, unspecified: Secondary | ICD-10-CM | POA: Diagnosis not present

## 2022-09-18 DIAGNOSIS — E7800 Pure hypercholesterolemia, unspecified: Secondary | ICD-10-CM | POA: Diagnosis not present

## 2022-09-18 DIAGNOSIS — Z0000 Encounter for general adult medical examination without abnormal findings: Secondary | ICD-10-CM | POA: Diagnosis not present

## 2022-09-18 DIAGNOSIS — R739 Hyperglycemia, unspecified: Secondary | ICD-10-CM | POA: Diagnosis not present

## 2022-09-25 DIAGNOSIS — Z0000 Encounter for general adult medical examination without abnormal findings: Secondary | ICD-10-CM | POA: Diagnosis not present

## 2022-09-25 DIAGNOSIS — R7989 Other specified abnormal findings of blood chemistry: Secondary | ICD-10-CM | POA: Diagnosis not present

## 2022-09-25 DIAGNOSIS — I4891 Unspecified atrial fibrillation: Secondary | ICD-10-CM | POA: Diagnosis not present

## 2022-09-25 DIAGNOSIS — E875 Hyperkalemia: Secondary | ICD-10-CM | POA: Diagnosis not present

## 2022-09-25 DIAGNOSIS — E039 Hypothyroidism, unspecified: Secondary | ICD-10-CM | POA: Diagnosis not present

## 2022-09-25 DIAGNOSIS — K219 Gastro-esophageal reflux disease without esophagitis: Secondary | ICD-10-CM | POA: Diagnosis not present

## 2022-09-25 DIAGNOSIS — R718 Other abnormality of red blood cells: Secondary | ICD-10-CM | POA: Diagnosis not present

## 2022-09-25 DIAGNOSIS — Z7901 Long term (current) use of anticoagulants: Secondary | ICD-10-CM | POA: Diagnosis not present

## 2022-09-26 DIAGNOSIS — I491 Atrial premature depolarization: Secondary | ICD-10-CM | POA: Diagnosis not present

## 2022-11-04 DIAGNOSIS — I700 Atherosclerosis of aorta: Secondary | ICD-10-CM | POA: Diagnosis not present

## 2022-11-04 DIAGNOSIS — R918 Other nonspecific abnormal finding of lung field: Secondary | ICD-10-CM | POA: Diagnosis not present

## 2022-11-04 DIAGNOSIS — K449 Diaphragmatic hernia without obstruction or gangrene: Secondary | ICD-10-CM | POA: Diagnosis not present

## 2022-11-04 DIAGNOSIS — R911 Solitary pulmonary nodule: Secondary | ICD-10-CM | POA: Diagnosis not present

## 2022-11-13 NOTE — Progress Notes (Signed)
Synopsis: Referred for lung mass by Pearson Grippe, MD  Subjective:   PATIENT ID: Samantha Hurst GENDER: female DOB: 24-Sep-1950, MRN: [id number]  Chief Complaint  Patient presents with   Consult   72yF with history of AF sp PVI ablation 04/2022, obesity, gerd, hypothyroid referred for RUL nodule   She does have chronic cough. Mostly when she's getting ready to eat and when she's exercising. It doesn't really bother her. She does have heartburn and is on prilosec for this - has been on it for years - she had perhaps gastritis on EGD a few years ago. No sinus congestion/PNC, no asthma growing up.   She never smoked. Lots of secondhand exposure growing up.   Otherwise pertinent review of systems is negative.  No family history of lung cancer. 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 No vaping/MJ. No pet bird. No recent international travel.   History reviewed. No pertinent past medical history.   History reviewed. No pertinent family history.   History reviewed. No pertinent surgical history.  Social History   Socioeconomic History   Marital status: Single    Spouse name: Not on file   Number of children: Not on file   Years of education: Not on file   Highest education level: Not on file  Occupational History   Not on file  Tobacco Use   Smoking status: Never   Smokeless tobacco: Never  Substance and Sexual Activity   Alcohol use: Not on file   Drug use: Not on file   Sexual activity: Not on file  Other Topics Concern   Not on file  Social History Narrative   Not on file   Social Determinants of Health   Financial Resource Strain: Not on file  Food Insecurity: Not on file  Transportation Needs: Not on file  Physical Activity: Not on file  Stress: Not on file  Social Connections: Not on file  Intimate Partner Violence: Not on file     Not on File   Outpatient Medications Prior to Visit  Medication Sig Dispense Refill    levothyroxine (SYNTHROID) 50 MCG tablet Take 50 mcg by mouth.     Multiple Vitamin (MULTI-VITAMIN) tablet Take 1 tablet by mouth daily.     omeprazole (PRILOSEC) 40 MG capsule Take 40 mg by mouth.     rosuvastatin (CRESTOR) 20 MG tablet Take 20 mg by mouth.     XARELTO 20 MG TABS tablet Take 20 mg by mouth.     No facility-administered medications prior to visit.       Objective:   Physical Exam:  General appearance: 72 y.o., female, NAD, conversant  Eyes: anicteric sclerae; PERRL, tracking appropriately HENT: NCAT; MMM Neck: Trachea midline; no lymphadenopathy, no JVD Lungs: CTAB, no crackles, no wheeze, with normal respiratory effort CV: RRR, no murmur  Abdomen: Soft, non-tender; non-distended, BS present  Extremities: No peripheral edema, warm Skin: Normal turgor and texture; no rash Psych: Appropriate affect Neuro: Alert and oriented to person and place, no focal deficit     Vitals:   11/17/22 1420  BP: (!) 128/92  Pulse: 69  SpO2: 99%  Weight: 177 lb 11.2 oz (80.6 kg)  Height: 5\' 8"  (1.727 m)   99% on RA BMI Readings from Last 3 Encounters:  11/17/22 27.02 kg/m   Wt Readings from Last 3 Encounters:  11/17/22 177 lb 11.2 oz (80.6 kg)     CBC No results found for: "WBC", "  RBC", "HGB", "HCT", "PLT", "MCV", "MCH", "MCHC", "RDW", "LYMPHSABS", "MONOABS", "EOSABS", "BASOSABS"    Chest Imaging: CT CHest 11/03/21 with enlarging RUL solid mass with a few foci of internal calcification, now 3cm in greatest dimension, hiatal hernia  Pulmonary Functions Testing Results:     No data to display          Echocardiogram 2022:   There is borderline concentric left ventricular hypertrophy.  Left ventricular systolic function is normal.  LV ejection fraction = 60-65%.  There is aortic valve sclerosis.  There is no aortic stenosis.  There is mild tricuspid regurgitation.  Estimated right ventricular systolic pressure is 26 mmHg.  There is no comparison study  available.       Assessment & Plan:   # RUL mass Though she is never smoker and there are a few calcifications in it, size and pace of growth highly concerning for lung cancer. She is very functional.   Plan: - PET/CT - PFT  - Referral to cardiothoracic surgery placed today. But also discussed options including nav bronch (though I don't think I'd be reassured by negative biopsy and would worry about progression if we waited on surveillance), more surveillance which I wouldn't recommend. Encouraged her also to seek second opinion if she wishes.      Omar Person, MD [facility] 11/17/2022 3:50 PM

## 2022-11-17 ENCOUNTER — Ambulatory Visit: Payer: Medicare PPO | Admitting: Student

## 2022-11-17 ENCOUNTER — Encounter: Payer: Self-pay | Admitting: Student

## 2022-11-17 VITALS — BP 128/92 | HR 69 | Ht 68.0 in | Wt 177.7 lb

## 2022-11-17 DIAGNOSIS — R911 Solitary pulmonary nodule: Secondary | ICD-10-CM

## 2022-11-17 NOTE — Patient Instructions (Signed)
-   PFTs you'll be called to schedule - Cardiothoracic surgery referral placed today - PET/CT scan ordered, you'll be called to schedule - please call or send message if you have any questions along the way

## 2022-11-18 ENCOUNTER — Encounter: Payer: Self-pay | Admitting: *Deleted

## 2022-11-26 DIAGNOSIS — M170 Bilateral primary osteoarthritis of knee: Secondary | ICD-10-CM | POA: Diagnosis not present

## 2022-11-26 DIAGNOSIS — M19071 Primary osteoarthritis, right ankle and foot: Secondary | ICD-10-CM | POA: Diagnosis not present

## 2022-11-26 DIAGNOSIS — M1712 Unilateral primary osteoarthritis, left knee: Secondary | ICD-10-CM | POA: Diagnosis not present

## 2022-11-26 DIAGNOSIS — M1711 Unilateral primary osteoarthritis, right knee: Secondary | ICD-10-CM | POA: Diagnosis not present

## 2022-12-05 ENCOUNTER — Encounter (HOSPITAL_COMMUNITY)
Admission: RE | Admit: 2022-12-05 | Discharge: 2022-12-05 | Disposition: A | Discharge status: Home / Self Care | Payer: Medicare PPO | Source: Ambulatory Visit | Attending: Student | Admitting: Student

## 2022-12-05 DIAGNOSIS — R911 Solitary pulmonary nodule: Secondary | ICD-10-CM | POA: Diagnosis not present

## 2022-12-05 LAB — GLUCOSE, CAPILLARY: Glucose-Capillary: 89 mg/dL (ref 70–99)

## 2022-12-05 MED ORDER — FLUDEOXYGLUCOSE F - 18 (FDG) INJECTION
18.7000 | Freq: Once | INTRAVENOUS | Status: AC | PRN
  Administered 2022-12-05: 8.43 via INTRAVENOUS

## 2022-12-09 ENCOUNTER — Encounter: Payer: Self-pay | Admitting: Thoracic Surgery (Cardiothoracic Vascular Surgery)

## 2022-12-09 ENCOUNTER — Encounter: Payer: Self-pay | Admitting: *Deleted

## 2022-12-09 ENCOUNTER — Institutional Professional Consult (permissible substitution) (INDEPENDENT_AMBULATORY_CARE_PROVIDER_SITE_OTHER): Payer: Medicare PPO | Admitting: Thoracic Surgery (Cardiothoracic Vascular Surgery)

## 2022-12-09 ENCOUNTER — Other Ambulatory Visit: Payer: Self-pay | Admitting: *Deleted

## 2022-12-09 ENCOUNTER — Other Ambulatory Visit: Payer: Self-pay | Admitting: Thoracic Surgery (Cardiothoracic Vascular Surgery)

## 2022-12-09 VITALS — BP 162/74 | HR 74 | Resp 20 | Ht 68.0 in | Wt 171.8 lb

## 2022-12-09 DIAGNOSIS — Z9889 Other specified postprocedural states: Secondary | ICD-10-CM

## 2022-12-09 DIAGNOSIS — R918 Other nonspecific abnormal finding of lung field: Secondary | ICD-10-CM

## 2022-12-09 DIAGNOSIS — R911 Solitary pulmonary nodule: Secondary | ICD-10-CM

## 2022-12-09 DIAGNOSIS — E039 Hypothyroidism, unspecified: Secondary | ICD-10-CM | POA: Insufficient documentation

## 2022-12-09 DIAGNOSIS — Z8679 Personal history of other diseases of the circulatory system: Secondary | ICD-10-CM | POA: Diagnosis not present

## 2022-12-09 DIAGNOSIS — E785 Hyperlipidemia, unspecified: Secondary | ICD-10-CM | POA: Insufficient documentation

## 2022-12-09 DIAGNOSIS — I4891 Unspecified atrial fibrillation: Secondary | ICD-10-CM | POA: Insufficient documentation

## 2022-12-09 NOTE — Progress Notes (Signed)
PCP is Fatima Sanger, FNP Referring Provider is Omar Person, MD  Chief Complaint  Patient presents with   Lung Lesion    Surgical consult, PET Scan 12/05/22, Chest CT 11/04/22    HPI: Samantha Hurst is sent for cough patient regarding a right upper lobe lung nodule.  Samantha Hurst is a 72 year old woman with a history of paroxysmal atrial fibrillation, hyperlipidemia, hypothyroidism, and a recently diagnosed right upper lobe lung nodule.  She is a lifelong non-smoker.  Was found to have a right upper lobe lung nodule on a CT done prior to an atrial fibrillation ablation last fall.  It showed a irregular shaped 17 x 8 mm nodule in the right upper lobe.  She recently had a follow-up CT which showed the nodule increased in size to 3.2 by 2.7 x 1.4 cm.  There was no mediastinal or hilar adenopathy.  There was a large hiatal hernia and also some aortic atherosclerosis.  She is not having any chest pain, pressure, tightness, or shortness of breath.  Denies palpitations.  Actually has been feeling rather well.  She has a chronic dry cough she has had for years but has not changed recently.  No change in appetite or weight loss.  She works out vigorously on a regular basis several times a week.  Zubrod Score: At the time of surgery this patient's most appropriate activity status/level should be described as: [x]     0    Normal activity, no symptoms []     1    Restricted in physical strenuous activity but ambulatory, able to do out light work []     2    Ambulatory and capable of self care, unable to do work activities, up and about >50 % of waking hours                              []     3    Only limited self care, in bed greater than 50% of waking hours []     4    Completely disabled, no self care, confined to bed or chair []     5    Moribund  Past medical history Patient Active Problem List   Diagnosis Date Noted   Mass of upper lobe of right lung 12/09/2022   Hypothyroidism  12/09/2022   Hyperlipidemia 12/09/2022   Atrial fibrillation (HCC) 12/09/2022   S/P ablation of atrial fibrillation 12/09/2022   Past surgical history Atrial fibrillation ablation   History reviewed. No pertinent family history.  Social History Social History   Tobacco Use   Smoking status: Never   Smokeless tobacco: Never    Current Outpatient Medications  Medication Sig Dispense Refill   levothyroxine (SYNTHROID) 50 MCG tablet Take 50 mcg by mouth.     Multiple Vitamin (MULTI-VITAMIN) tablet Take 1 tablet by mouth daily.     omeprazole (PRILOSEC) 40 MG capsule Take 40 mg by mouth.     rosuvastatin (CRESTOR) 20 MG tablet Take 20 mg by mouth.     XARELTO 20 MG TABS tablet Take 20 mg by mouth.     No current facility-administered medications for this visit.    Not on File  Review of Systems  Constitutional:  Negative for activity change and unexpected weight change.  HENT:  Negative for trouble swallowing and voice change.   Eyes:  Negative for visual disturbance.  Respiratory:  Positive for cough. Negative for shortness of  breath and wheezing.   Cardiovascular:  Negative for chest pain, palpitations and leg swelling.  Gastrointestinal:  Negative for abdominal distention and abdominal pain.  Genitourinary:  Negative for difficulty urinating and dysuria.  Musculoskeletal:  Negative for arthralgias.  Neurological:  Negative for seizures and syncope.  Hematological:  Negative for adenopathy. Does not bruise/bleed easily.  All other systems reviewed and are negative.   BP (!) 162/74 (BP Location: Left Arm, Patient Position: Sitting)   Pulse 74   Resp 20   Ht 5\' 8"  (1.727 m)   Wt 171 lb 12.8 oz (77.9 kg)   SpO2 98% Comment: RA  BMI 26.12 kg/m  Physical Exam Vitals reviewed.  Constitutional:      General: She is not in acute distress.    Appearance: Normal appearance.  HENT:     Head: Normocephalic and atraumatic.  Eyes:     General: No scleral icterus. Neck:      Vascular: No carotid bruit.  Cardiovascular:     Rate and Rhythm: Normal rate and regular rhythm.     Heart sounds: Normal heart sounds. No murmur heard.    No friction rub. No gallop.  Pulmonary:     Effort: Pulmonary effort is normal. No respiratory distress.     Breath sounds: Normal breath sounds. No wheezing or rales.  Abdominal:     General: There is no distension.     Palpations: Abdomen is soft.  Musculoskeletal:     Cervical back: Neck supple.  Lymphadenopathy:     Cervical: No cervical adenopathy.  Skin:    General: Skin is warm and dry.  Neurological:     General: No focal deficit present.     Mental Status: She is alert and oriented to person, place, and time.    Diagnostic Tests: I personally reviewed the CT and PET/CT images.  The PET/CT still has not been read despite our request.  There is an irregular nodule in the right upper lobe measuring 3.2 x 2.7 x 1.4 cm.  Increased in size compared to October.  Minimal change from CT in April to PET in May.  Mass is hypermetabolic on PET/CT.  I do not see any adenopathy.  Appears to be some mild hypermetabolic activity in the esophagus likely reflux associated.  Large hiatal hernia.  No distant disease.  There is some mild peripheral calcification of the nodule, but not enough to definitively rule out cancer.  Impression: Samantha Hurst is a 72 year old woman with a history of paroxysmal atrial fibrillation, hyperlipidemia, hypothyroidism, and a recently diagnosed right upper lobe lung nodule.  Right upper lobe lung nodule back in October when she had a CT prior to an atrial fibrillation ablation.  Over time that has increased in size.  It is hypermetabolic on PET/CT although are still awaiting the final reading on the PET.  I do not see any evidence of regional or distant metastatic disease.  Differential diagnosis includes primary bronchogenic carcinoma, carcinoid tumor, other less aggressive lung cancers, and  granulomatous disease.  Given the interval growth and activity on PET, I do not think we can just assume this is granulomatous disease.  My recommendation was that we proceed with robotic assisted right VATS for wedge resection for definitive diagnosis and then would proceed with a lobectomy at the same setting if the intraoperative frozen section shows malignancy.  Another alternative would be to do a navigational bronchoscopy.  Unfortunately out with the hesitant to rely on a negative biopsy given progression  and activity.  I do not think radiographic follow-up is appropriate.  I did tell her that she could take some time to think about this and was welcome to get a second opinion.  My recommendation was that we proceed with robotic assisted right VATS for wedge resection of the right upper lobe nodule and possible right upper lobectomy depending on intraoperative frozen section.  I informed Mrs. Mannella and her husband of the general nature of the procedure including the need for general anesthesia, the incisions to be used, the use of the surgical robot, the use of a drainage tube postoperatively, the expected hospital stay, and the overall recovery.  I informed them of the indications, risks, benefits, and alternatives.  They understand the risks include, but not limited to death, MI, DVT, PE, bleeding, possible need for transfusion, prolonged air leak, cardiac arrhythmias, as well as the possibility of other unforeseeable complications.  She is leaning towards proceeding with surgery and we have tentatively set a date for 01/12/2023.  She still needs pulmonary function testing although I suspect that will be fine given her activity levels and absence of a smoking history.  Plan: Pulmonary function testing with and without bronchodilators Robotic assisted right VATS for wedge resection and possible right upper lobectomy on Monday, 01/12/2023  [name], MD [facility] [phone]

## 2023-01-07 ENCOUNTER — Encounter (HOSPITAL_COMMUNITY): Payer: Self-pay

## 2023-01-07 NOTE — Pre-Procedure Instructions (Signed)
Surgical Instructions    Your procedure is scheduled on Monday, July 8th.  Report to [facility] Main Entrance "A" at 05:30 A.M., then check in with the Admitting office.  Call this number if you have problems the morning of surgery:  [phone]  If you have any questions prior to your surgery date call [phone]: Open Monday-Friday 8am-4pm If you experience any cold or flu symptoms such as cough, fever, chills, shortness of breath, etc. between now and your scheduled surgery, please notify us at the above number.     Remember:  Do not eat or drink after midnight the night before your surgery      Take these medicines the morning of surgery with A SIP OF WATER  levothyroxine (SYNTHROID)  omeprazole (PRILOSEC)  rosuvastatin (CRESTOR)   Follow your surgeon's instructions on when to stop Xarelto.  If no instructions were given by your surgeon then you will need to call the office to get those instructions.     As of today, STOP taking any Aspirin (unless otherwise instructed by your surgeon) Aleve, Naproxen, Ibuprofen, Motrin, Advil, Goody's, BC's, all herbal medications, fish oil, and all vitamins.                     Do NOT Smoke (Tobacco/Vaping) for 24 hours prior to your procedure.  If you use a CPAP at night, you may bring your mask/headgear for your overnight stay.   Contacts, glasses, piercing's, hearing aid's, dentures or partials may not be worn into surgery, please bring cases for these belongings.    For patients admitted to the hospital, discharge time will be determined by your treatment team.   Patients discharged the day of surgery will not be allowed to drive home, and someone needs to stay with them for 24 hours.  SURGICAL WAITING ROOM VISITATION Patients having surgery or a procedure may have no more than 2 support people in the waiting area - these visitors may rotate.   Children under the age of 51 must have an adult with them who is not the patient. If  the patient needs to stay at the hospital during part of their recovery, the visitor guidelines for inpatient rooms apply. Pre-op nurse will coordinate an appropriate time for 1 support person to accompany patient in pre-op.  This support person may not rotate.   Please refer to the [facility] website for the visitor guidelines for Inpatients (after your surgery is over and you are in a regular room).    Special instructions:   [facility]- Preparing For Surgery  Before surgery, you can play an important role. Because skin is not sterile, your skin needs to be as free of germs as possible. You can reduce the number of germs on your skin by washing with CHG (chlorahexidine gluconate) Soap before surgery.  CHG is an antiseptic cleaner which kills germs and bonds with the skin to continue killing germs even after washing.    Oral Hygiene is also important to reduce your risk of infection.  Remember - BRUSH YOUR TEETH THE MORNING OF SURGERY WITH YOUR REGULAR TOOTHPASTE  Please do not use if you have an allergy to CHG or antibacterial soaps. If your skin becomes reddened/irritated stop using the CHG.  Do not shave (including legs and underarms) for at least 48 hours prior to first CHG shower. It is OK to shave your face.  Please follow these instructions carefully.   Shower the NIGHT BEFORE SURGERY and the [facility]  OF SURGERY  If you chose to wash your hair, wash your hair first as usual with your normal shampoo.  After you shampoo, rinse your hair and body thoroughly to remove the shampoo.  Use CHG Soap as you would any other liquid soap. You can apply CHG directly to the skin and wash gently with a scrungie or a clean washcloth.   Apply the CHG Soap to your body ONLY FROM THE NECK DOWN.  Do not use on open wounds or open sores. Avoid contact with your eyes, ears, mouth and genitals (private parts). Wash Face and genitals (private parts)  with your normal soap.   Wash thoroughly, paying  special attention to the area where your surgery will be performed.  Thoroughly rinse your body with warm water from the neck down.  DO NOT shower/wash with your normal soap after using and rinsing off the CHG Soap.  Pat yourself dry with a CLEAN TOWEL.  Wear CLEAN PAJAMAS to bed the night before surgery  Place CLEAN SHEETS on your bed the night before your surgery  DO NOT SLEEP WITH PETS.   Day of Surgery: Take a shower with CHG soap. Do not wear jewelry or makeup Do not wear lotions, powders, perfumes/colognes, or deodorant. Do not shave 48 hours prior to surgery.  Men may shave face and neck. Do not bring valuables to the hospital.  [facility] is not responsible for any belongings or valuables. Do not wear nail polish, gel polish, artificial nails, or any other type of covering on natural nails (fingers and toes) If you have artificial nails or gel coating that need to be removed by a nail salon, please have this removed prior to surgery. Artificial nails or gel coating may interfere with anesthesia's ability to adequately monitor your vital signs. Wear Clean/Comfortable clothing the morning of surgery Remember to brush your teeth WITH YOUR REGULAR TOOTHPASTE.   Please read over the following fact sheets that you were given.    If you received a COVID test during your pre-op visit  it is requested that you wear a mask when out in public, stay away from anyone that may not be feeling well and notify your surgeon if you develop symptoms. If you have been in contact with anyone that has tested positive in the last 10 days please notify you surgeon.

## 2023-01-09 ENCOUNTER — Ambulatory Visit (HOSPITAL_COMMUNITY)
Admission: RE | Admit: 2023-01-09 | Discharge: 2023-01-09 | Disposition: A | Discharge status: Home / Self Care | Payer: Federal, State, Local not specified - PPO | Source: Ambulatory Visit | Attending: Thoracic Surgery (Cardiothoracic Vascular Surgery) | Admitting: Thoracic Surgery (Cardiothoracic Vascular Surgery)

## 2023-01-09 ENCOUNTER — Encounter (HOSPITAL_COMMUNITY): Payer: Self-pay

## 2023-01-09 ENCOUNTER — Encounter (HOSPITAL_COMMUNITY)
Admission: RE | Admit: 2023-01-09 | Discharge: 2023-01-09 | Disposition: A | Discharge status: Home / Self Care | Payer: Medicare PPO | Source: Ambulatory Visit | Attending: Thoracic Surgery (Cardiothoracic Vascular Surgery) | Admitting: Thoracic Surgery (Cardiothoracic Vascular Surgery)

## 2023-01-09 ENCOUNTER — Ambulatory Visit (HOSPITAL_COMMUNITY)
Admission: RE | Admit: 2023-01-09 | Discharge: 2023-01-09 | Disposition: A | Discharge status: Home / Self Care | Payer: Medicare PPO | Source: Ambulatory Visit | Attending: Thoracic Surgery (Cardiothoracic Vascular Surgery) | Admitting: Thoracic Surgery (Cardiothoracic Vascular Surgery)

## 2023-01-09 ENCOUNTER — Other Ambulatory Visit: Payer: Self-pay

## 2023-01-09 VITALS — BP 157/78 | HR 70 | Temp 98.0°F | Resp 17 | Ht 68.0 in | Wt 175.4 lb

## 2023-01-09 DIAGNOSIS — T380X5A Adverse effect of glucocorticoids and synthetic analogues, initial encounter: Secondary | ICD-10-CM | POA: Diagnosis not present

## 2023-01-09 DIAGNOSIS — E785 Hyperlipidemia, unspecified: Secondary | ICD-10-CM | POA: Diagnosis present

## 2023-01-09 DIAGNOSIS — Z7901 Long term (current) use of anticoagulants: Secondary | ICD-10-CM | POA: Diagnosis not present

## 2023-01-09 DIAGNOSIS — J181 Lobar pneumonia, unspecified organism: Secondary | ICD-10-CM | POA: Diagnosis not present

## 2023-01-09 DIAGNOSIS — Z01818 Encounter for other preprocedural examination: Secondary | ICD-10-CM

## 2023-01-09 DIAGNOSIS — Z981 Arthrodesis status: Secondary | ICD-10-CM | POA: Diagnosis not present

## 2023-01-09 DIAGNOSIS — Z1152 Encounter for screening for COVID-19: Secondary | ICD-10-CM | POA: Diagnosis not present

## 2023-01-09 DIAGNOSIS — M1990 Unspecified osteoarthritis, unspecified site: Secondary | ICD-10-CM | POA: Diagnosis not present

## 2023-01-09 DIAGNOSIS — K449 Diaphragmatic hernia without obstruction or gangrene: Secondary | ICD-10-CM | POA: Diagnosis present

## 2023-01-09 DIAGNOSIS — R911 Solitary pulmonary nodule: Secondary | ICD-10-CM | POA: Diagnosis present

## 2023-01-09 DIAGNOSIS — J8410 Pulmonary fibrosis, unspecified: Secondary | ICD-10-CM | POA: Diagnosis not present

## 2023-01-09 DIAGNOSIS — Z4682 Encounter for fitting and adjustment of non-vascular catheter: Secondary | ICD-10-CM | POA: Diagnosis not present

## 2023-01-09 DIAGNOSIS — R739 Hyperglycemia, unspecified: Secondary | ICD-10-CM | POA: Diagnosis not present

## 2023-01-09 DIAGNOSIS — J939 Pneumothorax, unspecified: Secondary | ICD-10-CM | POA: Diagnosis not present

## 2023-01-09 DIAGNOSIS — E039 Hypothyroidism, unspecified: Secondary | ICD-10-CM | POA: Diagnosis present

## 2023-01-09 DIAGNOSIS — K219 Gastro-esophageal reflux disease without esophagitis: Secondary | ICD-10-CM | POA: Diagnosis present

## 2023-01-09 DIAGNOSIS — I493 Ventricular premature depolarization: Secondary | ICD-10-CM | POA: Diagnosis present

## 2023-01-09 DIAGNOSIS — I700 Atherosclerosis of aorta: Secondary | ICD-10-CM | POA: Diagnosis present

## 2023-01-09 DIAGNOSIS — Z79899 Other long term (current) drug therapy: Secondary | ICD-10-CM | POA: Diagnosis not present

## 2023-01-09 DIAGNOSIS — Z79890 Hormone replacement therapy (postmenopausal): Secondary | ICD-10-CM | POA: Diagnosis not present

## 2023-01-09 DIAGNOSIS — I480 Paroxysmal atrial fibrillation: Secondary | ICD-10-CM | POA: Diagnosis present

## 2023-01-09 DIAGNOSIS — I4891 Unspecified atrial fibrillation: Secondary | ICD-10-CM | POA: Diagnosis not present

## 2023-01-09 DIAGNOSIS — Z9889 Other specified postprocedural states: Secondary | ICD-10-CM | POA: Diagnosis not present

## 2023-01-09 HISTORY — DX: Personal history of other diseases of the digestive system: Z87.19

## 2023-01-09 HISTORY — DX: Gastro-esophageal reflux disease without esophagitis: K21.9

## 2023-01-09 HISTORY — DX: Cardiac arrhythmia, unspecified: I49.9

## 2023-01-09 HISTORY — DX: Unspecified osteoarthritis, unspecified site: M19.90

## 2023-01-09 HISTORY — DX: Paroxysmal atrial fibrillation: I48.0

## 2023-01-09 HISTORY — DX: Solitary pulmonary nodule: R91.1

## 2023-01-09 HISTORY — DX: Hypothyroidism, unspecified: E03.9

## 2023-01-09 HISTORY — DX: Hyperlipidemia, unspecified: E78.5

## 2023-01-09 LAB — CBC
HCT: 41.3 % (ref 36.0–46.0)
Hemoglobin: 13.6 g/dL (ref 12.0–15.0)
MCH: 34.2 pg — ABNORMAL HIGH (ref 26.0–34.0)
MCHC: 32.9 g/dL (ref 30.0–36.0)
MCV: 103.8 fL — ABNORMAL HIGH (ref 80.0–100.0)
Platelets: 195 10*3/uL (ref 150–400)
RBC: 3.98 MIL/uL (ref 3.87–5.11)
RDW: 13.2 % (ref 11.5–15.5)
WBC: 4.2 10*3/uL (ref 4.0–10.5)
nRBC: 0 % (ref 0.0–0.2)

## 2023-01-09 LAB — COMPREHENSIVE METABOLIC PANEL
ALT: 22 U/L (ref 0–44)
AST: 37 U/L (ref 15–41)
Albumin: 3.9 g/dL (ref 3.5–5.0)
Alkaline Phosphatase: 86 U/L (ref 38–126)
Anion gap: 11 (ref 5–15)
BUN: 11 mg/dL (ref 8–23)
CO2: 25 mmol/L (ref 22–32)
Calcium: 9.2 mg/dL (ref 8.9–10.3)
Chloride: 102 mmol/L (ref 98–111)
Creatinine, Ser: 0.78 mg/dL (ref 0.44–1.00)
GFR, Estimated: >60 mL/min (ref >60)
Glucose, Bld: 89 mg/dL (ref 70–99)
Potassium: 3.8 mmol/L (ref 3.5–5.1)
Sodium: 138 mmol/L (ref 135–145)
Total Bilirubin: 1.5 mg/dL — ABNORMAL HIGH (ref 0.3–1.2)
Total Protein: 7.2 g/dL (ref 6.5–8.1)

## 2023-01-09 LAB — PULMONARY FUNCTION TEST
DL/VA % pred: 114 %
DL/VA: 4.62 ml/min/mmHg/L
DLCO unc % pred: 119 %
DLCO unc: 25.58 ml/min/mmHg
FEF 25-75 Pre: 2.89 L/sec
FEF2575-%Pred-Pre: 145 %
FEV1-%Pred-Pre: 111 %
FEV1-Pre: 2.79 L
FEV1FVC-%Pred-Pre: 107 %
FEV6-%Pred-Pre: 109 %
FEV6-Pre: 3.44 L
FEV6FVC-%Pred-Pre: 104 %
FVC-%Pred-Pre: 104 %
FVC-Pre: 3.44 L
Pre FEV1/FVC ratio: 81 %
Pre FEV6/FVC Ratio: 100 %
RV % pred: 157 %
RV: 3.74 L
TLC % pred: 132 %
TLC: 7.31 L

## 2023-01-09 LAB — URINALYSIS, ROUTINE W REFLEX MICROSCOPIC
Bilirubin Urine: NEGATIVE
Glucose, UA: NEGATIVE mg/dL
Hgb urine dipstick: NEGATIVE
Ketones, ur: NEGATIVE mg/dL
Leukocytes,Ua: NEGATIVE
Nitrite: NEGATIVE
Protein, ur: NEGATIVE mg/dL
Specific Gravity, Urine: 1.02 (ref 1.005–1.030)
pH: 6.5 (ref 5.0–8.0)

## 2023-01-09 LAB — APTT: aPTT: 33 seconds (ref 24–36)

## 2023-01-09 LAB — SARS CORONAVIRUS 2 (TAT 6-24 HRS): SARS Coronavirus 2: NEGATIVE

## 2023-01-09 LAB — PROTIME-INR
INR: 0.9 (ref 0.8–1.2)
Prothrombin Time: 12.7 seconds (ref 11.4–15.2)

## 2023-01-09 LAB — TYPE AND SCREEN
ABO/RH(D): O POS
Antibody Screen: NEGATIVE

## 2023-01-09 LAB — SURGICAL PCR SCREEN
MRSA, PCR: NEGATIVE
Staphylococcus aureus: NEGATIVE

## 2023-01-09 MED ORDER — ALBUTEROL SULFATE (2.5 MG/3ML) 0.083% IN NEBU: 2.5000 mg | INHALATION_SOLUTION | Freq: Once | RESPIRATORY_TRACT | Status: DC

## 2023-01-09 NOTE — Progress Notes (Signed)
Anesthesia Chart Review:  Case: [id number] Date/Time: 01/12/23 1232   Procedure: XI ROBOTIC ASSISTED THORACOSCOPY- RIGHT UPPER LOBE WEDGE RESECTION, POSSIBLE LOBECTOMY (Right: Chest)   Anesthesia type: General   Pre-op diagnosis: RUL nodule   Location: [facility] ROOM 10 / [facility]   Surgeons: [name], MD       DISCUSSION: Patient is a 72 year old female scheduled for the above procedure. Recent imaging showed an enlarging RUL lung lesion.  History includes never smoker, PAF (s/p RFA, AF/PVI ablation 04/23/22), HLD, hypothyroidism, hiatal hernia, GERD.   She is followed by [facility] Dr. Maud Deed for afib, s/p ablation 04/23/22. 01/09/23 EKG showed NSR with occasional PVC. She reported instructions to hold Xarelto for 2 days prior to surgery.    Dr. Dorris Fetch classified Zubrod Score as 0: Normal activity, no symptoms.   Anesthesia team to evaluate on the day of surgery.  Preoperative CXR and COVID-19 test are still in process.   VS: BP (!) 157/78   Pulse 70   Temp 36.7 C   Resp 17   Ht 5\' 8"  (1.727 m)   Wt 79.6 kg   SpO2 100%   BMI 26.67 kg/m   PROVIDERS: Fatima Sanger, FNP is PCP  Palamaner, Elly Modena, MD is EP ([facility]) Felisa Bonier, MD is pulmonologist   LABS: Labs reviewed: Acceptable for surgery. She needs an ABG on the day of surgery.  (all labs ordered are listed, but only abnormal results are displayed)  Labs Reviewed  CBC - Abnormal; Notable for the following components:      Result Value   MCV 103.8 (*)    MCH 34.2 (*)    All other components within normal limits  COMPREHENSIVE METABOLIC PANEL - Abnormal; Notable for the following components:   Total Bilirubin 1.5 (*)    All other components within normal limits  SURGICAL PCR SCREEN  SARS CORONAVIRUS 2 (TAT 6-24 HRS)  PROTIME-INR  APTT  URINALYSIS, ROUTINE W REFLEX MICROSCOPIC  TYPE AND SCREEN    PFTs 01/09/23: FVC 3.44 (104%), FEV1 2.79 (11%). DLCO unc 25.58  (119%).   IMAGES: PET Scan 12/05/22: IMPRESSION: 1. Anterior right upper lobe pulmonary lesion is markedly hypermetabolic. Imaging features are highly suspicious for primary bronchogenic carcinoma. 2. No evidence for hypermetabolic metastatic disease in the neck, chest, abdomen, or pelvis. 3. Focal hypermetabolism along the right spine at the T12-L1 level. Coronal imaging from previous chest CT of 11/04/2022 shows a lateral disc herniation at this level. FDG uptake in this region is probably related to inflammatory change from the disc herniation. While metastatic involvement is considered unlikely, thoracic spine MRI with and without contrast could be used to further evaluate as clinically warranted. Attention on follow-up recommended. 4. Moderate to large hiatal hernia. 5.  Aortic Atherosclerosis (ICD10-I70.0).    CT Chest 11/04/22 ([facility]): IMPRESSION: 1. Interval progression of the somewhat elongated multi lobular  partially calcified lesion in the right upper lobe. Dominant nodular  component was measured previously at 1.7 x 0.8 cm is now 2.3 x 1.2  cm. Cranially, the lesion has become more confluent and overall  measurements of the entire lesion are on the order of 3.2 x 2.7 x  1.4 cm. PET-CT recommended to further evaluate.  2. Large hiatal hernia with 50-75% of the stomach contained in the  chest.  3. Aortic Atherosclerosis (ICD10-I70.0).    EKG: 01/09/23:  Sinus rhythm with occasional Premature ventricular complexes Septal infarct , age undetermined   CV: EP  Procedure 04/24/22 ([facility]): Procedures:  1  Pulmonary vein isolation  2.  Transseptal puncture  3.  Comprehensive electrophysiology study  4.  Intracardiac echocardiography use  5.  3D mapping  6.  Left atrial pacing and recording   TEE 04/22/22 ([facility]): SUMMARY  Normal LV size, wall thickness, wall motion and systolic function with  ejection fraction 60-65%  The right ventricle is normal in  size and function. The left atrium is  moderately dilated.  No thrombus is detected in the left atrial appendage.  The right atrium is mildly dilated.  There is mild tricuspid regurgitation.  No pulmonary hypertension.  The thoracic aorta is normal.   CT Heart/Chest 04/22/22 ([facility]): FINDINGS:  Cardiovascular:  - Heart: No cardiomegaly. No pericardial fluid/thickening. No significant  coronary calcifications. No left heart filling defects.  - Aorta: No significant aortic valve calcifications.  - No significant atherosclerotic changes of the aorta. Branch vessels  are patent with a 3 vessel arch. Cervical cerebral vessels patent at  the base of the neck. No pedunculated plaque, ulcerated plaque, dissection, periaortic fluid. No wall thickening.  - Pulmonary arteries: Timing of the contrast bolus is not optimized for evaluation of pulmonary artery filling defects. Unremarkable size of the main  pulmonary artery.  - Mediastinum/Nodes: No mediastinal adenopathy.  Hiatal hernia.  - Unremarkable appearance of the thoracic inlet.  - Lungs/Pleura: Nodule within the right upper lobe with some internal  calcifications, measuring 17 mm x 8 mm. Some linear calcifications  extend peripherally from the more central nodule. No comparison CT  available. No pneumothorax. No pleural effusion.  - Upper Abdomen: No acute finding of the upper abdomen.  - Musculoskeletal: No acute displaced fracture. Degenerative changes  of the spine.   CT Coronary Calcium Scoring 09/19/20 ([facility]): IMPRESSION: The total coronary calcium score is 1 which is between the 0 and 25th percentile for a female patient this age.    Past Medical History:  Diagnosis Date   Arthritis    right ankle   Dysrhythmia    A. Fib   GERD (gastroesophageal reflux disease)    History of hiatal hernia    HLD (hyperlipidemia)    Hypothyroidism    Nodule of upper lobe of right lung    Paroxysmal A-fib (HCC)     Past Surgical  History:  Procedure Laterality Date   ANKLE FUSION Right    APPENDECTOMY     As a child   ATRIAL FIBRILLATION ABLATION     COLONOSCOPY WITH ESOPHAGOGASTRODUODENOSCOPY (EGD)  2022   KNEE ARTHROSCOPY Right    x2   TONSILLECTOMY     As a child    MEDICATIONS:  levothyroxine (SYNTHROID) 50 MCG tablet   Multiple Vitamin (MULTI-VITAMIN) tablet   omeprazole (PRILOSEC) 40 MG capsule   rosuvastatin (CRESTOR) 20 MG tablet   TURMERIC CURCUMIN PO   XARELTO 20 MG TABS tablet   No current facility-administered medications for this encounter.    Shonna Chock, PA-C Surgical Short Stay/Anesthesiology [facility] Phone [phone] [facility] Phone [phone] 01/09/2023 3:51 PM

## 2023-01-09 NOTE — Progress Notes (Signed)
ABG sample collected in PAT clotted. Will need recollect DOS. Order placed.

## 2023-01-09 NOTE — Progress Notes (Signed)
Patient was called to informed that the surgery time for Monday was changed to 12:47 o'clock. Patient was instructed to be at the hospital on Monday morning at 10:47 o'clock. Patient verbalized understanding.

## 2023-01-09 NOTE — Progress Notes (Signed)
PCP - Fatima Sanger, FNP Cardiologist - Encarnacion Slates, FNP (MD left practice)  PPM/ICD - Denies Device Orders - n/a Rep Notified - n/a  Chest x-ray - 01/09/2023 EKG - 01/09/2023 Stress Test - Denies ECHO - 04/22/2022 Cardiac Cath - Denies  Sleep Study - Denies CPAP - n/a  No DM  Last dose of GLP1 agonist- n/a GLP1 instructions: n/a  Blood Thinner Instructions: Pt instructed to stop taking Xarelto 2 days prior to surgery, but forgot to take dose July 4th. So last dose was July 3rd. Aspirin Instructions: n/a   NPO after midnight   COVID TEST- Yes. Result pending.   Anesthesia review: Yes. Hx of A. Fib on Xarelto. Abnormal EKG review.   Patient denies shortness of breath, fever, cough and chest pain at PAT appointment. Pt denies any respiratory illness/infection in the last two months.   All instructions explained to the patient, with a verbal understanding of the material. Patient agrees to go over the instructions while at home for a better understanding. Patient also instructed to self quarantine after being tested for COVID-19. The opportunity to ask questions was provided.

## 2023-01-09 NOTE — Anesthesia Preprocedure Evaluation (Signed)
Anesthesia Evaluation  Patient identified by MRN, date of birth, ID band Patient awake    Reviewed: Allergy & Precautions, H&P , NPO status , Patient's Chart, lab work & pertinent test results  Airway Mallampati: II  TM Distance: >3 FB Neck ROM: Full    Dental no notable dental hx. (+) Teeth Intact, Dental Advisory Given   Pulmonary neg pulmonary ROS   Pulmonary exam normal breath sounds clear to auscultation       Cardiovascular + dysrhythmias Atrial Fibrillation  Rhythm:Regular Rate:Normal     Neuro/Psych negative neurological ROS  negative psych ROS   GI/Hepatic Neg liver ROS, hiatal hernia,GERD  Medicated,,  Endo/Other  Hypothyroidism    Renal/GU negative Renal ROS  negative genitourinary   Musculoskeletal  (+) Arthritis , Osteoarthritis,    Abdominal   Peds  Hematology negative hematology ROS (+)   Anesthesia Other Findings   Reproductive/Obstetrics negative OB ROS                             Anesthesia Physical Anesthesia Plan  ASA: 3  Anesthesia Plan: General   Post-op Pain Management: Tylenol PO (pre-op)*   Induction: Intravenous  PONV Risk Score and Plan: 4 or greater and Ondansetron, Dexamethasone and Treatment may vary due to age or medical condition  Airway Management Planned: Double Lumen EBT  Additional Equipment: Arterial line  Intra-op Plan:   Post-operative Plan: Extubation in OR  Informed Consent: I have reviewed the patients History and Physical, chart, labs and discussed the procedure including the risks, benefits and alternatives for the proposed anesthesia with the patient or authorized representative who has indicated his/her understanding and acceptance.     Dental advisory given  Plan Discussed with: CRNA  Anesthesia Plan Comments: (PAT note written 01/09/2023 by Shonna Chock, PA-C.  )       Anesthesia Quick Evaluation

## 2023-01-12 ENCOUNTER — Inpatient Hospital Stay (HOSPITAL_COMMUNITY): Payer: Medicare PPO

## 2023-01-12 ENCOUNTER — Encounter (HOSPITAL_COMMUNITY): Payer: Self-pay | Admitting: Thoracic Surgery (Cardiothoracic Vascular Surgery)

## 2023-01-12 ENCOUNTER — Inpatient Hospital Stay (HOSPITAL_COMMUNITY): Payer: Medicare PPO | Admitting: Vascular Surgery

## 2023-01-12 ENCOUNTER — Encounter (HOSPITAL_COMMUNITY)
Admission: RE | Disposition: A | Discharge status: Home / Self Care | Payer: Self-pay | Source: Home / Self Care | Attending: Thoracic Surgery (Cardiothoracic Vascular Surgery)

## 2023-01-12 ENCOUNTER — Other Ambulatory Visit: Payer: Self-pay

## 2023-01-12 ENCOUNTER — Inpatient Hospital Stay (HOSPITAL_COMMUNITY)
Admission: RE | Admit: 2023-01-12 | Discharge: 2023-01-13 | DRG: 168 | Disposition: A | Discharge status: Home / Self Care | Payer: Medicare PPO | Attending: Thoracic Surgery (Cardiothoracic Vascular Surgery) | Admitting: Thoracic Surgery (Cardiothoracic Vascular Surgery)

## 2023-01-12 ENCOUNTER — Inpatient Hospital Stay (HOSPITAL_COMMUNITY): Payer: Medicare PPO | Admitting: Certified Registered"

## 2023-01-12 DIAGNOSIS — K219 Gastro-esophageal reflux disease without esophagitis: Secondary | ICD-10-CM | POA: Diagnosis present

## 2023-01-12 DIAGNOSIS — T380X5A Adverse effect of glucocorticoids and synthetic analogues, initial encounter: Secondary | ICD-10-CM | POA: Diagnosis not present

## 2023-01-12 DIAGNOSIS — I700 Atherosclerosis of aorta: Secondary | ICD-10-CM | POA: Diagnosis present

## 2023-01-12 DIAGNOSIS — E785 Hyperlipidemia, unspecified: Secondary | ICD-10-CM

## 2023-01-12 DIAGNOSIS — Z7901 Long term (current) use of anticoagulants: Secondary | ICD-10-CM

## 2023-01-12 DIAGNOSIS — I480 Paroxysmal atrial fibrillation: Secondary | ICD-10-CM | POA: Diagnosis present

## 2023-01-12 DIAGNOSIS — J8410 Pulmonary fibrosis, unspecified: Secondary | ICD-10-CM | POA: Diagnosis not present

## 2023-01-12 DIAGNOSIS — Z79899 Other long term (current) drug therapy: Secondary | ICD-10-CM | POA: Diagnosis not present

## 2023-01-12 DIAGNOSIS — I4891 Unspecified atrial fibrillation: Secondary | ICD-10-CM | POA: Diagnosis not present

## 2023-01-12 DIAGNOSIS — Z79890 Hormone replacement therapy (postmenopausal): Secondary | ICD-10-CM | POA: Diagnosis not present

## 2023-01-12 DIAGNOSIS — K449 Diaphragmatic hernia without obstruction or gangrene: Secondary | ICD-10-CM | POA: Diagnosis present

## 2023-01-12 DIAGNOSIS — E039 Hypothyroidism, unspecified: Secondary | ICD-10-CM | POA: Diagnosis present

## 2023-01-12 DIAGNOSIS — R911 Solitary pulmonary nodule: Secondary | ICD-10-CM

## 2023-01-12 DIAGNOSIS — R739 Hyperglycemia, unspecified: Secondary | ICD-10-CM | POA: Diagnosis not present

## 2023-01-12 DIAGNOSIS — Z981 Arthrodesis status: Secondary | ICD-10-CM | POA: Diagnosis not present

## 2023-01-12 DIAGNOSIS — I493 Ventricular premature depolarization: Secondary | ICD-10-CM | POA: Diagnosis present

## 2023-01-12 DIAGNOSIS — Z1152 Encounter for screening for COVID-19: Secondary | ICD-10-CM | POA: Diagnosis not present

## 2023-01-12 HISTORY — PX: INTERCOSTAL NERVE BLOCK: SHX5021

## 2023-01-12 HISTORY — PX: LYMPH NODE DISSECTION: SHX5087

## 2023-01-12 LAB — BLOOD GAS, ARTERIAL
Acid-Base Excess: 0.3 mmol/L (ref 0.0–2.0)
Bicarbonate: 23.3 mmol/L (ref 20.0–28.0)
O2 Saturation: 99 %
Patient temperature: 37
pCO2 arterial: 32 mmHg (ref 32–48)
pH, Arterial: 7.47 — ABNORMAL HIGH (ref 7.35–7.45)
pO2, Arterial: 138 mmHg — ABNORMAL HIGH (ref 83–108)

## 2023-01-12 LAB — ABO/RH: ABO/RH(D): O POS

## 2023-01-12 SURGERY — WEDGE RESECTION, LUNG, ROBOT-ASSISTED, THORACOSCOPIC
Anesthesia: General | Site: Chest | Laterality: Right

## 2023-01-12 MED ORDER — 0.9 % SODIUM CHLORIDE (POUR BTL) OPTIME
TOPICAL | Status: DC | PRN
  Administered 2023-01-12: 2000 mL

## 2023-01-12 MED ORDER — DEXAMETHASONE SODIUM PHOSPHATE 10 MG/ML IJ SOLN
INTRAMUSCULAR | Status: DC | PRN
  Administered 2023-01-12: 10 mg via INTRAVENOUS

## 2023-01-12 MED ORDER — ACETAMINOPHEN 160 MG/5ML PO SOLN: 1000.0000 mg | Freq: Four times a day (QID) | ORAL | Status: DC

## 2023-01-12 MED ORDER — ONDANSETRON HCL 4 MG/2ML IJ SOLN
INTRAMUSCULAR | Status: DC | PRN
  Administered 2023-01-12: 4 mg via INTRAVENOUS

## 2023-01-12 MED ORDER — MORPHINE SULFATE (PF) 2 MG/ML IV SOLN: 2.0000 mg | INTRAVENOUS | Status: DC | PRN

## 2023-01-12 MED ORDER — PROPOFOL 10 MG/ML IV BOLUS
INTRAVENOUS | Status: DC | PRN
  Administered 2023-01-12: 100 mg via INTRAVENOUS

## 2023-01-12 MED ORDER — BISACODYL 5 MG PO TBEC
10.0000 mg | DELAYED_RELEASE_TABLET | Freq: Every day | ORAL | Status: DC
  Administered 2023-01-12: 10 mg via ORAL
  Filled 2023-01-12 (×2): qty 2

## 2023-01-12 MED ORDER — ENOXAPARIN SODIUM 40 MG/0.4ML IJ SOSY
40.0000 mg | PREFILLED_SYRINGE | Freq: Every day | INTRAMUSCULAR | Status: DC
  Administered 2023-01-13: 40 mg via SUBCUTANEOUS
  Filled 2023-01-12: qty 0.4

## 2023-01-12 MED ORDER — OXYCODONE HCL 5 MG PO TABS: 5.0000 mg | ORAL_TABLET | ORAL | Status: DC | PRN

## 2023-01-12 MED ORDER — LACTATED RINGERS IV SOLN: INTRAVENOUS | Status: DC | PRN

## 2023-01-12 MED ORDER — EPHEDRINE SULFATE-NACL 50-0.9 MG/10ML-% IV SOSY
PREFILLED_SYRINGE | INTRAVENOUS | Status: DC | PRN
  Administered 2023-01-12 (×3): 5 mg via INTRAVENOUS

## 2023-01-12 MED ORDER — LACTATED RINGERS IV SOLN: INTRAVENOUS | Status: DC

## 2023-01-12 MED ORDER — HYDROMORPHONE HCL 1 MG/ML IJ SOLN: 0.2500 mg | INTRAMUSCULAR | Status: DC | PRN

## 2023-01-12 MED ORDER — GABAPENTIN 300 MG PO CAPS: 300.0000 mg | ORAL_CAPSULE | Freq: Two times a day (BID) | ORAL | Status: DC

## 2023-01-12 MED ORDER — CEFAZOLIN SODIUM-DEXTROSE 2-4 GM/100ML-% IV SOLN
2.0000 g | Freq: Three times a day (TID) | INTRAVENOUS | Status: AC
  Administered 2023-01-12 – 2023-01-13 (×2): 2 g via INTRAVENOUS
  Filled 2023-01-12 (×2): qty 100

## 2023-01-12 MED ORDER — ONDANSETRON HCL 4 MG/2ML IJ SOLN: 4.0000 mg | Freq: Four times a day (QID) | INTRAMUSCULAR | Status: DC | PRN

## 2023-01-12 MED ORDER — ACETAMINOPHEN 500 MG PO TABS
1000.0000 mg | ORAL_TABLET | Freq: Once | ORAL | Status: AC
  Administered 2023-01-12: 1000 mg via ORAL

## 2023-01-12 MED ORDER — ACETAMINOPHEN 500 MG PO TABS
ORAL_TABLET | ORAL | Status: AC
  Administered 2023-01-13: 1000 mg via ORAL
  Filled 2023-01-12: qty 2

## 2023-01-12 MED ORDER — LUNG SURGERY BOOK
Freq: Once | Status: DC
  Filled 2023-01-12: qty 1

## 2023-01-12 MED ORDER — ONDANSETRON HCL 4 MG/2ML IJ SOLN
INTRAMUSCULAR | Status: AC
  Filled 2023-01-12: qty 2

## 2023-01-12 MED ORDER — LIDOCAINE 2% (20 MG/ML) 5 ML SYRINGE
INTRAMUSCULAR | Status: AC
  Filled 2023-01-12: qty 5

## 2023-01-12 MED ORDER — DEXAMETHASONE SODIUM PHOSPHATE 10 MG/ML IJ SOLN
INTRAMUSCULAR | Status: AC
  Filled 2023-01-12: qty 1

## 2023-01-12 MED ORDER — PANTOPRAZOLE SODIUM 40 MG PO TBEC
40.0000 mg | DELAYED_RELEASE_TABLET | Freq: Every day | ORAL | Status: DC
  Administered 2023-01-13: 40 mg via ORAL
  Filled 2023-01-12: qty 1

## 2023-01-12 MED ORDER — MENTHOL 3 MG MT LOZG: 1.0000 | LOZENGE | OROMUCOSAL | Status: DC | PRN

## 2023-01-12 MED ORDER — PHENYLEPHRINE HCL-NACL 20-0.9 MG/250ML-% IV SOLN
INTRAVENOUS | Status: DC | PRN
  Administered 2023-01-12: 60 ug/min via INTRAVENOUS

## 2023-01-12 MED ORDER — SUGAMMADEX SODIUM 200 MG/2ML IV SOLN
INTRAVENOUS | Status: DC | PRN
  Administered 2023-01-12: 200 mg via INTRAVENOUS

## 2023-01-12 MED ORDER — LIDOCAINE 2% (20 MG/ML) 5 ML SYRINGE
INTRAMUSCULAR | Status: DC | PRN
  Administered 2023-01-12: 60 mg via INTRAVENOUS

## 2023-01-12 MED ORDER — KETOROLAC TROMETHAMINE 15 MG/ML IJ SOLN
15.0000 mg | Freq: Four times a day (QID) | INTRAMUSCULAR | Status: DC
  Administered 2023-01-12 – 2023-01-13 (×4): 15 mg via INTRAVENOUS
  Filled 2023-01-12 (×4): qty 1

## 2023-01-12 MED ORDER — SENNOSIDES-DOCUSATE SODIUM 8.6-50 MG PO TABS: 1.0000 | ORAL_TABLET | Freq: Every day | ORAL | Status: DC

## 2023-01-12 MED ORDER — LEVOTHYROXINE SODIUM 50 MCG PO TABS
50.0000 ug | ORAL_TABLET | Freq: Every day | ORAL | Status: DC
  Administered 2023-01-13: 50 ug via ORAL
  Filled 2023-01-12: qty 1

## 2023-01-12 MED ORDER — PHENYLEPHRINE 80 MCG/ML (10ML) SYRINGE FOR IV PUSH (FOR BLOOD PRESSURE SUPPORT)
PREFILLED_SYRINGE | INTRAVENOUS | Status: DC | PRN
  Administered 2023-01-12: 160 ug via INTRAVENOUS

## 2023-01-12 MED ORDER — CHLORHEXIDINE GLUCONATE CLOTH 2 % EX PADS
6.0000 | MEDICATED_PAD | Freq: Every day | CUTANEOUS | Status: DC
  Administered 2023-01-12 – 2023-01-13 (×2): 6 via TOPICAL

## 2023-01-12 MED ORDER — ROCURONIUM BROMIDE 10 MG/ML (PF) SYRINGE
PREFILLED_SYRINGE | INTRAVENOUS | Status: DC | PRN
  Administered 2023-01-12: 50 mg via INTRAVENOUS
  Administered 2023-01-12: 40 mg via INTRAVENOUS
  Administered 2023-01-12: 60 mg via INTRAVENOUS

## 2023-01-12 MED ORDER — ACETAMINOPHEN 500 MG PO TABS
1000.0000 mg | ORAL_TABLET | Freq: Four times a day (QID) | ORAL | Status: DC
  Administered 2023-01-12 – 2023-01-13 (×3): 1000 mg via ORAL
  Filled 2023-01-12 (×4): qty 2

## 2023-01-12 MED ORDER — EPHEDRINE 5 MG/ML INJ
INTRAVENOUS | Status: AC
  Filled 2023-01-12: qty 5

## 2023-01-12 MED ORDER — PHENYLEPHRINE 80 MCG/ML (10ML) SYRINGE FOR IV PUSH (FOR BLOOD PRESSURE SUPPORT)
PREFILLED_SYRINGE | INTRAVENOUS | Status: AC
  Filled 2023-01-12: qty 10

## 2023-01-12 MED ORDER — MIDAZOLAM HCL 2 MG/2ML IJ SOLN
INTRAMUSCULAR | Status: DC | PRN
  Administered 2023-01-12: 2 mg via INTRAVENOUS

## 2023-01-12 MED ORDER — FENTANYL CITRATE (PF) 250 MCG/5ML IJ SOLN
INTRAMUSCULAR | Status: DC | PRN
  Administered 2023-01-12: 50 ug via INTRAVENOUS
  Administered 2023-01-12 (×2): 100 ug via INTRAVENOUS

## 2023-01-12 MED ORDER — BUPIVACAINE HCL (PF) 0.5 % IJ SOLN
INTRAMUSCULAR | Status: AC
  Filled 2023-01-12: qty 30

## 2023-01-12 MED ORDER — GABAPENTIN 300 MG PO CAPS
300.0000 mg | ORAL_CAPSULE | Freq: Every day | ORAL | Status: DC
  Administered 2023-01-12: 300 mg via ORAL
  Filled 2023-01-12: qty 1

## 2023-01-12 MED ORDER — ROSUVASTATIN CALCIUM 20 MG PO TABS
20.0000 mg | ORAL_TABLET | Freq: Every day | ORAL | Status: DC
  Administered 2023-01-13: 20 mg via ORAL
  Filled 2023-01-12: qty 1

## 2023-01-12 MED ORDER — ROCURONIUM BROMIDE 10 MG/ML (PF) SYRINGE
PREFILLED_SYRINGE | INTRAVENOUS | Status: AC
  Filled 2023-01-12: qty 10

## 2023-01-12 MED ORDER — ALBUMIN HUMAN 5 % IV SOLN: INTRAVENOUS | Status: DC | PRN

## 2023-01-12 MED ORDER — SODIUM CHLORIDE FLUSH 0.9 % IV SOLN
INTRAVENOUS | Status: DC | PRN
  Administered 2023-01-12: 100 mL

## 2023-01-12 MED ORDER — CHLORHEXIDINE GLUCONATE 0.12 % MT SOLN
15.0000 mL | Freq: Once | OROMUCOSAL | Status: AC
  Administered 2023-01-12: 15 mL via OROMUCOSAL
  Filled 2023-01-12: qty 15

## 2023-01-12 MED ORDER — ORAL CARE MOUTH RINSE: 15.0000 mL | Freq: Once | OROMUCOSAL | Status: AC

## 2023-01-12 MED ORDER — SODIUM CHLORIDE 0.45 % IV SOLN: INTRAVENOUS | Status: DC

## 2023-01-12 MED ORDER — PROPOFOL 10 MG/ML IV BOLUS
INTRAVENOUS | Status: AC
  Filled 2023-01-12: qty 20

## 2023-01-12 MED ORDER — FENTANYL CITRATE (PF) 250 MCG/5ML IJ SOLN
INTRAMUSCULAR | Status: AC
  Filled 2023-01-12: qty 5

## 2023-01-12 MED ORDER — CEFAZOLIN SODIUM-DEXTROSE 2-4 GM/100ML-% IV SOLN
2.0000 g | INTRAVENOUS | Status: AC
  Administered 2023-01-12: 2 g via INTRAVENOUS
  Filled 2023-01-12: qty 100

## 2023-01-12 MED ORDER — HEMOSTATIC AGENTS (NO CHARGE) OPTIME
TOPICAL | Status: DC | PRN
  Administered 2023-01-12 (×2): 1 via TOPICAL

## 2023-01-12 MED ORDER — BUPIVACAINE LIPOSOME 1.3 % IJ SUSP
INTRAMUSCULAR | Status: AC
  Filled 2023-01-12: qty 20

## 2023-01-12 SURGICAL SUPPLY — 84 items
ADH SKN CLS APL DERMABOND .7 (GAUZE/BANDAGES/DRESSINGS) ×1
BAG SPEC RTRVL C125 8X14 (MISCELLANEOUS) ×1
CANISTER SUCT 3000ML PPV (MISCELLANEOUS) ×2 IMPLANT
CANNULA REDUCER 12-8 DVNC XI (CANNULA) ×2 IMPLANT
CNTNR URN SCR LID CUP LEK RST (MISCELLANEOUS) ×5 IMPLANT
CONN ST 1/4X3/8 BEN (MISCELLANEOUS) IMPLANT
CONT SPEC 4OZ STRL OR WHT (MISCELLANEOUS) ×8
DEFOGGER SCOPE WARMER CLEARIFY (MISCELLANEOUS) ×1 IMPLANT
DERMABOND ADVANCED .7 DNX12 (GAUZE/BANDAGES/DRESSINGS) ×1 IMPLANT
DRAIN CHANNEL 28F RND 3/8 FF (WOUND CARE) IMPLANT
DRAPE ARM DVNC X/XI (DISPOSABLE) ×4 IMPLANT
DRAPE COLUMN DVNC XI (DISPOSABLE) ×1 IMPLANT
DRAPE CV SPLIT W-CLR ANES SCRN (DRAPES) ×1 IMPLANT
DRAPE HALF SHEET 40X57 (DRAPES) ×1 IMPLANT
DRAPE INCISE IOBAN 66X45 STRL (DRAPES) IMPLANT
DRAPE ORTHO SPLIT 77X108 STRL (DRAPES) ×1
DRAPE SURG ORHT 6 SPLT 77X108 (DRAPES) ×1 IMPLANT
ELECT BLADE 6.5 EXT (BLADE) IMPLANT
ELECT REM PT RETURN 9FT ADLT (ELECTROSURGICAL) ×1
ELECTRODE REM PT RTRN 9FT ADLT (ELECTROSURGICAL) ×1 IMPLANT
FORCEPS BPLR FENES DVNC XI (FORCEP) IMPLANT
FORCEPS BPLR LNG DVNC XI (INSTRUMENTS) IMPLANT
GAUZE KITTNER 4X5 RF (MISCELLANEOUS) ×2 IMPLANT
GAUZE SPONGE 4X4 12PLY STRL (GAUZE/BANDAGES/DRESSINGS) ×1 IMPLANT
GLOVE SS BIOGEL STRL SZ 7.5 (GLOVE) ×1 IMPLANT
GOWN STRL REUS W/ TWL LRG LVL3 (GOWN DISPOSABLE) ×2 IMPLANT
GOWN STRL REUS W/ TWL XL LVL3 (GOWN DISPOSABLE) ×2 IMPLANT
GOWN STRL REUS W/TWL 2XL LVL3 (GOWN DISPOSABLE) ×1 IMPLANT
GOWN STRL REUS W/TWL LRG LVL3 (GOWN DISPOSABLE) ×2
GOWN STRL REUS W/TWL XL LVL3 (GOWN DISPOSABLE) ×2
GRASPER TIP-UP FEN DVNC XI (INSTRUMENTS) IMPLANT
HEMOSTAT SURGICEL 2X14 (HEMOSTASIS) ×3 IMPLANT
IRRIGATION STRYKERFLOW (MISCELLANEOUS) ×1 IMPLANT
IRRIGATOR STRYKERFLOW (MISCELLANEOUS) ×1
KIT BASIN OR (CUSTOM PROCEDURE TRAY) ×1 IMPLANT
KIT SUCTION CATH 14FR (SUCTIONS) IMPLANT
KIT TURNOVER KIT B (KITS) ×1 IMPLANT
NDL HYPO 25GX1X1/2 BEV (NEEDLE) ×1 IMPLANT
NDL SPNL 22GX3.5 QUINCKE BK (NEEDLE) ×1 IMPLANT
NEEDLE HYPO 25GX1X1/2 BEV (NEEDLE) ×1 IMPLANT
NEEDLE SPNL 22GX3.5 QUINCKE BK (NEEDLE) ×1 IMPLANT
NS IRRIG 1000ML POUR BTL (IV SOLUTION) ×1 IMPLANT
PACK CHEST (CUSTOM PROCEDURE TRAY) ×1 IMPLANT
PAD ARMBOARD 7.5X6 YLW CONV (MISCELLANEOUS) ×2 IMPLANT
PORT ACCESS TROCAR AIRSEAL 12 (TROCAR) ×1 IMPLANT
RELOAD STAPLE 45 3.5 BLU DVNC (STAPLE) IMPLANT
RELOAD STAPLE 45 4.3 GRN DVNC (STAPLE) IMPLANT
RELOAD STAPLE 45 4.6 BLK DVNC (STAPLE) IMPLANT
RELOAD STAPLER 3.5X45 BLU DVNC (STAPLE) ×2 IMPLANT
RELOAD STAPLER 4.3X45 GRN DVNC (STAPLE) ×2 IMPLANT
RELOAD STAPLER 45 4.6 BLK DVNC (STAPLE) ×1 IMPLANT
SCISSORS LAP 5X35 DISP (ENDOMECHANICALS) IMPLANT
SEAL UNIV 5-12 XI (MISCELLANEOUS) ×4 IMPLANT
SET TRI-LUMEN FLTR TB AIRSEAL (TUBING) ×1 IMPLANT
SOL ELECTROSURG ANTI STICK (MISCELLANEOUS) ×1
SOL PREP POV-IOD 4OZ 10% (MISCELLANEOUS) IMPLANT
SOL SCRUB PVP POV-IOD 4OZ 7.5% (MISCELLANEOUS) ×1
SOLUTION ELECTROSURG ANTI STCK (MISCELLANEOUS) ×1 IMPLANT
SOLUTION SCRB POV-IOD 4OZ 7.5% (MISCELLANEOUS) IMPLANT
SPONGE TONSIL 1 RF SGL (DISPOSABLE) IMPLANT
STAPLER 45 SUREFORM CVD DVNC (STAPLE) IMPLANT
STAPLER RELOAD 3.5X45 BLU DVNC (STAPLE) ×2
STAPLER RELOAD 4.3X45 GRN DVNC (STAPLE) ×2
STAPLER RELOAD 45 4.6 BLK DVNC (STAPLE) ×1
SUT PDS AB 3-0 SH 27 (SUTURE) IMPLANT
SUT PROLENE 4 0 RB 1 (SUTURE)
SUT PROLENE 4-0 RB1 .5 CRCL 36 (SUTURE) IMPLANT
SUT SILK 1 MH (SUTURE) ×2 IMPLANT
SUT SILK 2 0 SH (SUTURE) IMPLANT
SUT SILK 2 0SH CR/8 30 (SUTURE) IMPLANT
SUT SILK 3 0SH CR/8 30 (SUTURE) IMPLANT
SUT VIC AB 1 CTX 36 (SUTURE)
SUT VIC AB 1 CTX36XBRD ANBCTR (SUTURE) IMPLANT
SUT VIC AB 2-0 CTX 36 (SUTURE) IMPLANT
SUT VIC AB 3-0 X1 27 (SUTURE) ×2 IMPLANT
SUT VICRYL 0 TIES 12 18 (SUTURE) ×1 IMPLANT
SUT VICRYL 0 UR6 27IN ABS (SUTURE) ×2 IMPLANT
SYR 20ML LL LF (SYRINGE) ×2 IMPLANT
SYSTEM RETRIEVAL ANCHOR 8 (MISCELLANEOUS) IMPLANT
SYSTEM SAHARA CHEST DRAIN ATS (WOUND CARE) ×1 IMPLANT
TAPE CLOTH 4X10 WHT NS (GAUZE/BANDAGES/DRESSINGS) ×1 IMPLANT
TOWEL GREEN STERILE (TOWEL DISPOSABLE) ×2 IMPLANT
TRAY FOLEY MTR SLVR 16FR STAT (SET/KITS/TRAYS/PACK) ×1 IMPLANT
WATER STERILE IRR 1000ML POUR (IV SOLUTION) ×1 IMPLANT

## 2023-01-12 NOTE — Anesthesia Procedure Notes (Signed)
Arterial Line Insertion Start/End7/02/2023 12:12 PM, 01/12/2023 12:22 PM Performed by: Gaynelle Adu, MD  Patient location: Pre-op. Preanesthetic checklist: patient identified, IV checked, site marked, risks and benefits discussed, surgical consent, monitors and equipment checked, pre-op evaluation, timeout performed and anesthesia consent Lidocaine 1% used for infiltration Left, radial was placed Catheter size: 20 G Hand hygiene performed , maximum sterile barriers used  and Seldinger technique used  Attempts: 1 Procedure performed using ultrasound guided technique. Ultrasound Notes:anatomy identified, needle tip was noted to be adjacent to the nerve/plexus identified, no ultrasound evidence of intravascular and/or intraneural injection and image(s) printed for medical record Following insertion, dressing applied and Biopatch. Post procedure assessment: normal and unchanged  Post procedure complications: unsuccessful attempts and second provider assisted. Patient tolerated the procedure well with no immediate complications.

## 2023-01-12 NOTE — Anesthesia Procedure Notes (Signed)
Procedure Name: Intubation Date/Time: 01/12/2023 1:58 PM  Performed by: Elliot Dally, CRNAPre-anesthesia Checklist: Patient identified, Emergency Drugs available, Suction available and Patient being monitored Patient Re-evaluated:Patient Re-evaluated prior to induction Oxygen Delivery Method: Circle System Utilized Preoxygenation: Pre-oxygenation with 100% oxygen Induction Type: IV induction Ventilation: Mask ventilation without difficulty Laryngoscope Size: Glidescope and 4 Grade View: Grade I Tube type: Oral Endobronchial tube: Left, Double lumen EBT, EBT position confirmed by auscultation and EBT position confirmed by fiberoptic bronchoscope and 35 Fr Number of attempts: 1 Airway Equipment and Method: Stylet, Oral airway and Video-laryngoscopy Placement Confirmation: ETT inserted through vocal cords under direct vision, positive ETCO2 and breath sounds checked- equal and bilateral Secured at: 29 cm Tube secured with: Tape Dental Injury: Teeth and Oropharynx as per pre-operative assessment  Difficulty Due To: Difficult Airway- due to anterior larynx Comments: Multiple attempts to pass 70F and 8F vivasight using Miller 2, Mac 3, and Glide scope were unsuccessful do to anterior airway and inability to pass the double lumen endotracheal tubes through the vocal cords. Attempt to pass a single lumen 7.5 ETT was also unsuccessful. Patient was intubated with 6.0 ETT, cook catheter placed, and attempt to pass 8F VivaSight through vocal cords was also unsuccessful. 51F traditional double lumen endotracheal tube was successfully placed using the glide scope and two provider maneuverings. Placement was confirmed using video bronchoscope. For future intubation, recommend use of 6.5 or smaller ETT and availability of glide scope.

## 2023-01-12 NOTE — H&P (Signed)
PCP is Fatima Sanger, FNP Referring Provider is Omar Person, MD       Chief Complaint  Patient presents with   Lung Lesion      Surgical consult, PET Scan 12/05/22, Chest CT 11/04/22      HPI: Samantha Hurst is sent for cough patient regarding a right upper lobe lung nodule.   Samantha Hurst is a 72 year old woman with a history old woman with a history of paroxysmal atrial fibrillation, hyperlipidemia, hypothyroidism, and a recently diagnosed right upper lobe lung nodule.   She is a lifelong non-smoker.  Was found to have a right upper lobe lung nodule on a CT done prior to an atrial fibrillation ablation last fall.  It showed a irregular shaped 17 x 8 mm nodule in the right upper lobe.  She recently had a follow-up CT which showed the nodule increased in size to 3.2 by 2.7 x 1.4 cm.  There was no mediastinal or hilar adenopathy.  There was a large hiatal hernia and also some aortic atherosclerosis.   She is not having any chest pain, pressure, tightness, or shortness of breath.  Denies palpitations.  Actually has been feeling rather well.  She has a chronic dry cough she has had for years but has not changed recently.  No change in appetite or weight loss.  She works out vigorously on a regular basis several times a week.   Zubrod Score: At the time of surgery this patient's most appropriate activity status/level should be described as: [x]     0    Normal activity, no symptoms []     1    Restricted in physical strenuous activity but ambulatory, able to do out light work []     2    Ambulatory and capable of self care, unable to do work activities, up and about >50 % of waking hours                              []     3    Only limited self care, in bed greater than 50% of waking hours []     4    Completely disabled, no self care, confined to bed or chair []     5    Moribund   Past medical history     Patient Active Problem List    Diagnosis Date Noted   Mass of upper lobe of right lung  12/09/2022   Hypothyroidism 12/09/2022   Hyperlipidemia 12/09/2022   Atrial fibrillation (HCC) 12/09/2022   S/P ablation of atrial fibrillation 12/09/2022    Past surgical history Atrial fibrillation ablation     History reviewed. No pertinent family history.   Social History Social History        Tobacco Use   Smoking status: Never   Smokeless tobacco: Never            Current Outpatient Medications  Medication Sig Dispense Refill   levothyroxine (SYNTHROID) 50 MCG tablet Take 50 mcg by mouth.       Multiple Vitamin (MULTI-VITAMIN) tablet Take 1 tablet by mouth daily.       omeprazole (PRILOSEC) 40 MG capsule Take 40 mg by mouth.       rosuvastatin (CRESTOR) 20 MG tablet Take 20 mg by mouth.       XARELTO 20 MG TABS tablet Take 20 mg by mouth.        No current facility-administered medications for this visit.  Not on File   Review of Systems  Constitutional:  Negative for activity change and unexpected weight change.  HENT:  Negative for trouble swallowing and voice change.   Eyes:  Negative for visual disturbance.  Respiratory:  Positive for cough. Negative for shortness of breath and wheezing.   Cardiovascular:  Negative for chest pain, palpitations and leg swelling.  Gastrointestinal:  Negative for abdominal distention and abdominal pain.  Genitourinary:  Negative for difficulty urinating and dysuria.  Musculoskeletal:  Negative for arthralgias.  Neurological:  Negative for seizures and syncope.  Hematological:  Negative for adenopathy. Does not bruise/bleed easily.  All other systems reviewed and are negative.     BP (!) 162/74 (BP Location: Left Arm, Patient Position: Sitting)   Pulse 74   Resp 20   Ht 5\' 8"  (1.727 m)   Wt 171 lb 12.8 oz (77.9 kg)   SpO2 98% Comment: RA  BMI 26.12 kg/m  Physical Exam Vitals reviewed.  Constitutional:      General: She is not in acute distress.    Appearance: Normal appearance.  HENT:     Head: Normocephalic  and atraumatic.  Eyes:     General: No scleral icterus. Neck:     Vascular: No carotid bruit.  Cardiovascular:     Rate and Rhythm: Normal rate and regular rhythm.     Heart sounds: Normal heart sounds. No murmur heard.    No friction rub. No gallop.  Pulmonary:     Effort: Pulmonary effort is normal. No respiratory distress.     Breath sounds: Normal breath sounds. No wheezing or rales.  Abdominal:     General: There is no distension.     Palpations: Abdomen is soft.  Musculoskeletal:     Cervical back: Neck supple.  Lymphadenopathy:     Cervical: No cervical adenopathy.  Skin:    General: Skin is warm and dry.  Neurological:     General: No focal deficit present.     Mental Status: She is alert and oriented to person, place, and time.      Diagnostic Tests: I personally reviewed the CT and PET/CT images.  The PET/CT still has not been read despite our request.  There is an irregular nodule in the right upper lobe measuring 3.2 x 2.7 x 1.4 cm.  Increased in size compared to October.  Minimal change from CT in April to PET in May.  Mass is hypermetabolic on PET/CT.  I do not see any adenopathy.  Appears to be some mild hypermetabolic activity in the esophagus likely reflux associated.  Large hiatal hernia.  No distant disease.  There is some mild peripheral calcification of the nodule, but not enough to definitively rule out cancer.   Impression: Samantha Hurst is a 72 year old woman with a history of paroxysmal atrial fibrillation, hyperlipidemia, hypothyroidism, and a recently diagnosed right upper lobe lung nodule.  Right upper lobe lung nodule back in October when she had a CT prior to an atrial fibrillation ablation.  Over time that has increased in size.  It is hypermetabolic on PET/CT although are still awaiting the final reading on the PET.  I do not see any evidence of regional or distant metastatic disease.  Differential diagnosis includes primary bronchogenic carcinoma,  carcinoid tumor, other less aggressive lung cancers, and granulomatous disease.  Given the interval growth and activity on PET, I do not think we can just assume this is granulomatous disease.   My recommendation was that we proceed  with robotic assisted right VATS for wedge resection for definitive diagnosis and then would proceed with a lobectomy at the same setting if the intraoperative frozen section shows malignancy.  Another alternative would be to do a navigational bronchoscopy.  Unfortunately out with the hesitant to rely on a negative biopsy given progression and activity.  I do not think radiographic follow-up is appropriate.   I did tell her that she could take some time to think about this and was welcome to get a second opinion.   My recommendation was that we proceed with robotic assisted right VATS for wedge resection of the right upper lobe nodule and possible right upper lobectomy depending on intraoperative frozen section.  I informed Mrs. Mcqueen and her husband of the general nature of the procedure including the need for general anesthesia, the incisions to be used, the use of the surgical robot, the use of a drainage tube postoperatively, the expected hospital stay, and the overall recovery.  I informed them of the indications, risks, benefits, and alternatives.  They understand the risks include, but not limited to death, MI, DVT, PE, bleeding, possible need for transfusion, prolonged air leak, cardiac arrhythmias, as well as the possibility of other unforeseeable complications.   She is leaning towards proceeding with surgery and we have tentatively set a date for 01/12/2023.   She still needs pulmonary function testing although I suspect that will be fine given her activity levels and absence of a smoking history.   Plan: Pulmonary function testing with and without bronchodilators Robotic assisted right VATS for wedge resection and possible right upper lobectomy on Monday,  01/12/2023   [name], MD [facility] [phone]          Electronically signed by [name], MD at 12/09/2022  4:50 PM     Surgical Consult on 12/09/2022

## 2023-01-12 NOTE — Transfer of Care (Signed)
Immediate Anesthesia Transfer of Care Note  Patient: Samantha Hurst  Procedure(s) Performed: XI ROBOTIC ASSISTED THORACOSCOPY- RIGHT UPPER LOBE WEDGE RESECTION (Right: Chest) INTERCOSTAL NERVE BLOCK (Right: Chest) LYMPH NODE DISSECTION (Right: Chest)  Patient Location: PACU  Anesthesia Type:General  Level of Consciousness: awake and alert   Airway & Oxygen Therapy: Patient Spontanous Breathing and Patient connected to face mask oxygen  Post-op Assessment: Report given to RN and Post -op Vital signs reviewed and stable  Post vital signs: Reviewed and stable  Last Vitals:  Vitals Value Taken Time  BP 135/74 01/12/23 1615  Temp    Pulse 62 01/12/23 1622  Resp 15 01/12/23 1622  SpO2 94 % 01/12/23 1622  Vitals shown include unvalidated device data.  Last Pain:  Vitals:   01/12/23 1045  TempSrc:   PainSc: 0-No pain      Patients Stated Pain Goal: 0 (01/12/23 1045)  Complications: No notable events documented.

## 2023-01-12 NOTE — Interval H&P Note (Signed)
History and Physical Interval Note:   PFTs normal  01/12/2023 1:16 PM  Samantha Hurst  has presented today for surgery, with the diagnosis of RIGHT UPPER LOBE NODULE.  The various methods of treatment have been discussed with the patient and family. After consideration of risks, benefits and other options for treatment, the patient has consented to  Procedure(s): XI ROBOTIC ASSISTED THORACOSCOPY- RIGHT UPPER LOBE WEDGE RESECTION, POSSIBLE LOBECTOMY (Right) as a surgical intervention.  The patient's history has been reviewed, patient examined, no change in status, stable for surgery.  I have reviewed the patient's chart and labs.  Questions were answered to the patient's satisfaction.     [name]

## 2023-01-12 NOTE — Hospital Course (Addendum)
HPI: Samantha Hurst is sent for cough patient regarding a right upper lobe lung nodule.   Samantha Hurst is a 72 year old woman with a history of paroxysmal atrial fibrillation, hyperlipidemia, hypothyroidism, and a recently diagnosed right upper lobe lung nodule.   She is a lifelong non-smoker.  Was found to have a right upper lobe lung nodule on a CT done prior to an atrial fibrillation ablation last fall. It showed a irregular shaped 17 x 8 mm nodule in the right upper lobe.  She recently had a follow-up CT which showed the nodule increased in size to 3.2 by 2.7 x 1.4 cm.  There was no mediastinal or hilar adenopathy.  There was a large hiatal hernia and also some aortic atherosclerosis.   She is not having any chest pain, pressure, tightness, or shortness of breath.  Denies palpitations.  Actually has been feeling rather well.  She has a chronic dry cough she has had for years but has not changed recently.  No change in appetite or weight loss.  She works out vigorously on a regular basis several times a week.  Dr. Dorris Fetch reviewed the patient's diagnostic studies and determined she would benefit from surgical intervention. He reviewed the patient's treatment options as well as the risks and benefits of surgery. Samantha Hurst was agreeable to proceed with surgery.   Hospital Course: Samantha Hurst arrived at [facility] and was brought to the operating room on 01/12/23. She underwent robotic assisted right upper lobe wedge resection. She tolerated the procedure well, she was extubated and transferred to the PACU in stable condition. CXR showed a small right apical pneumothorax but chest tube was without an air leak. Chest tube was removed without complication and follow up CXR showed stable right apical space. Xarelto would be resumed on 01/15/23. She had an elevated glucose likely due to Dexamethasone, follow up CBG was 130 and glucose in 06/2022 was in the normal range. She was  educated to continue close follow ups with her PCP. She was ambulating well and saturating well on room air. Her incisions were healing well without sign of infection. She was felt stable for discharge home.

## 2023-01-12 NOTE — Discharge Instructions (Signed)
Robot-Assisted Thoracic Surgery, Care After The following information offers guidance on how to care for yourself after your procedure. Your health care provider may also give you more specific instructions. If you have problems or questions, contact your health care provider. What can I expect after the procedure? After the procedure, it is common to have: Some pain and aches in the area of your surgical incisions. Pain when breathing in (inhaling) and coughing. Tiredness (fatigue). Trouble sleeping. Constipation. Follow these instructions at home: Medicines Take over-the-counter and prescription medicines only as told by your health care provider. If you were prescribed an antibiotic medicine, take it as told by your health care provider. Do not stop taking the antibiotic even if you start to feel better. Talk with your health care provider about safe and effective ways to manage pain after your procedure. Pain management should fit your specific health needs. Take pain medicine before pain becomes severe. Relieving and controlling your pain will make breathing easier for you. Ask your health care provider if the medicine prescribed to you requires you to avoid driving or using machinery. Eating and drinking Follow instructions from your health care provider about eating or drinking restrictions. These will vary depending on what procedure you had. Your health care provider may recommend: A liquid diet or soft diet for the first few days. Meals that are smaller and more frequent. A diet of fruits, vegetables, whole grains, and low-fat proteins. Limiting foods that are high in fat and processed sugar, including fried or sweet foods. Incision care Follow instructions from your health care provider about how to take care of your incisions. Make sure you: Wash your hands with soap and water for at least 20 seconds before and after you change your bandage (dressing). If soap and water are not  available, use hand sanitizer. Change your dressing as told by your health care provider. Leave stitches (sutures), skin glue, or adhesive strips in place. These skin closures may need to stay in place for 2 weeks or longer. If adhesive strip edges start to loosen and curl up, you may trim the loose edges. Do not remove adhesive strips completely unless your health care provider tells you to do that. Check your incision area every day for signs of infection. Check for: Redness, swelling, or more pain. Fluid or blood. Warmth. Pus or a bad smell. Activity Return to your normal activities as told by your health care provider. Ask your health care provider what activities are safe for you. Ask your health care provider when it is safe for you to drive. Do not lift anything that is heavier than 10 lb (4.5 kg), or the limit that you are told, until your health care provider says that it is safe. Rest as told by your health care provider. Avoid sitting for a long time without moving. Get up to take short walks every 1-2 hours. This is important to improve blood flow and breathing. Ask for help if you feel weak or unsteady. Do exercises as told by your health care provider. Pneumonia prevention  Do deep breathing exercises and cough regularly as directed. This helps clear mucus and opens your lungs. Doing this helps prevent lung infection (pneumonia). If you were given an incentive spirometer, use it as told. An incentive spirometer is a tool that measures how well you are filling your lungs with each breath. Coughing may hurt less if you try to support your chest. This is called splinting. Try one of these when you  cough: Hold a pillow against your chest. Place the palms of both hands on top of your incision area. Do not use any products that contain nicotine or tobacco. These products include cigarettes, chewing tobacco, and vaping devices, such as e-cigarettes. If you need help quitting, ask your  health care provider. Avoid secondhand smoke. General instructions If you have a drainage tube: Follow instructions from your health care provider about how to take care of it. Do not travel by airplane after your tube is removed until your health care provider tells you it is safe. You may need to take these actions to prevent or treat constipation: Drink enough fluid to keep your urine pale yellow. Take over-the-counter or prescription medicines. Eat foods that are high in fiber, such as beans, whole grains, and fresh fruits and vegetables. Limit foods that are high in fat and processed sugars, such as fried or sweet foods. Keep all follow-up visits. This is important. Contact a health care provider if: You have redness, swelling, or more pain around an incision. You have fluid or blood coming from an incision. An incision feels warm to the touch. You have pus or a bad smell coming from an incision. You have a fever. You cannot eat or drink without vomiting. Your pain medicine is not controlling your pain. Get help right away if: You have chest pain. Your heart is beating quickly. You have trouble breathing. You have trouble speaking. You are confused. You feel weak or dizzy, or you faint. These symptoms may represent a serious problem that is an emergency. Do not wait to see if the symptoms will go away. Get medical help right away. Call your local emergency services (911 in the [place] Do not drive yourself to the hospital. Summary Talk with your health care provider about safe and effective ways to manage pain after your procedure. Pain management should fit your specific health needs. Return to your normal activities as told by your health care provider. Ask your health care provider what activities are safe for you. Do deep breathing exercises and cough regularly as directed. This helps to clear mucus and prevent pneumonia. If it hurts to cough, ease pain by holding a pillow  against your chest or by placing the palms of both hands over your incisions. This information is not intended to replace advice given to you by your health care provider. Make sure you discuss any questions you have with your health care provider. Document Revised: 03/16/2020 Document Reviewed: 03/16/2020 Elsevier Patient Education  2024 [employer]  You may shower, please wash incisions daily with soap and water and keep dry.  If you wish to cover wounds with dressing you may do so but please keep clean and change daily.  No tub baths or swimming until incisions have completely healed.  If your incisions become red or develop any drainage please call our office at [phone]  No Driving until cleared by Dr. Sunday Corn office and you are no longer using narcotic pain medications  Fever of 101.5 for at least 24 hours with no source, please contact our office at [phone]  Activity- up as tolerated, please walk at least 3 times per day.  Avoid strenuous activity, no lifting, pushing, or pulling with your arms over 8-10 lbs until you see Dr. Dorris Fetch in the office  If any questions or concerns arise, please do not hesitate to contact our office at [phone]

## 2023-01-12 NOTE — Op Note (Signed)
[name], SOTTILE MEDICAL RECORD NO: [id number] ACCOUNT NO: [id number] DATE OF BIRTH: July 05, 1951 FACILITY: [facility] LOCATION: [facility] PHYSICIAN: Salvatore Decent. Dorris Fetch, MD  Operative Report   DATE OF PROCEDURE: 01/12/2023  PREOPERATIVE DIAGNOSIS:  Right upper lobe lung nodule.  POSTOPERATIVE DIAGNOSIS:  Granuloma right upper lobe.  PROCEDURE:   Robotic-assisted right upper lobe wedge resection,  Lymph node sampling and  Intercostal nerve blocks levels 3 through 10.  SURGEON:  Salvatore Decent. Dorris Fetch, MD  ASSISTANT:  Aloha Gell, PA  Experienced assistance was necessary for this case due to surgical complexity.  Aloha Gell assisted with port placement, camera management, robot docking and undocking, instrument exchange, specimen retrieval, and wound closure.  ANESTHESIA:  General.  FINDINGS:  Nodule clearly visible in right upper lobe.  Frozen section revealed granulomatous disease.  CLINICAL NOTE:  Samantha Hurst is a 72 year old woman found to have a lung nodule prior to evaluation for ablation for atrial fibrillation. On followup the nodule had increased in size and was also hypermetabolic on PET/CT. She was advised to undergo surgical resection with a differential diagnosis of primary bronchogenic carcinoma versus granulomatous disease.  Indications, risks, benefits, and alternatives were discussed in detail with the patient.  She understood and accepted the risks and agreed to proceed.  OPERATIVE NOTE:  Samantha Hurst was brought to the preoperative holding area on 01/12/2023.  Anesthesia established intravenous access and placed an arterial blood pressure monitoring line.  She was taken to the operating room and anesthetized and intubated with a double lumen endotracheal tube.  Intravenous antibiotics were administered.  Sequential compression devices were placed on the calves for DVT prophylaxis.  A Foley catheter was placed.  She was placed in a left lateral decubitus  position and the right chest was prepped and draped in the usual sterile fashion.  A Bair Hugger was in place for active warming.  Single lung ventilation of the left lung was initiated and was tolerated well throughout the procedure.  A timeout was performed.  A solution containing 20 mL of liposomal bupivacaine, 30 mL of 0.5% bupivacaine and 50 mL of saline was prepared.  This solution was used for local at the incision sites as well as for the intercostal nerve blocks.  An incision was made in the eighth interspace in the midaxillary line, and an 8 mm robotic port was inserted.  The scope was advanced into the chest.  There was good isolation of the right lung.  Carbon dioxide was insufflated per protocol.  A 12 mm robotic port was  placed in the eighth interspace anterior to the camera port.  Intercostal nerve blocks then were performed from the third to the tenth interspace injecting 10 mL of the bupivacaine solution into a subpleural plane at each level. A 12 mm AirSeal port was placed in the tenth interspace posterolaterally and 2 additional robotic ports were placed in the eighth interspace, the robot was deployed.  The camera arm was docked, targeting was performed.  The remaining arms were docked.  The robotic instruments  were inserted with thoracoscopic visualization.  There was good isolation of the right lung and it had deflated nicely.  The nodule was clearly visible in the anterior superior portion of the right upper lobe. A wedge resection was performed using the robotic stapler.  Multiple firings were performed using blue, green and black cartridges depending on tissue thickness. Specimen then was placed into an endoscopic retrieval bag and removed. A small portion was sent for AFB and fungal  cultures.  The remainder was sent for frozen section.  While awaiting the results of the frozen section, the inferior ligament was divided with bipolar cautery and a level 9 node was identified.  It was  removed and sent for permanent pathology.  The pleural reflection was divided at the hilum posteriorly and a large, but otherwise benign-appearing level 7 node was identified.  That was also removed and sent for pathology.  There was some bleeding with removal of the node and Surgicel was placed into the lymph node bed.  There was no bleeding noted when that was removed later during the procedure.  At this point, we waited for the frozen section to return.  It showed granulomatous disease with no evidence of malignancy.  The staple line was inspected and there was good hemostasis.  The sponges that had been placed during the procedure were removed.  The robotic instruments were removed.  The port sites were all inspected and there was good hemostasis at all the port sites.  The robot was undocked.  The robotic ports were removed.  A 28-French Blake drain was placed through the anterior port incision in the eighth interspace and secured with #1 Silk suture. It was directed to the apex.  Dual lung ventilation was resumed.  The remaining incisions were closed in standard fashion.  The chest tube was placed to a Pleur-Evac on  waterseal.  She was placed back in a supine position.  She was extubated in the operating room and taken to the postanesthetic care unit in good condition.  All sponge, needle and instrument counts were correct at the end of the procedure.   PUS D: 01/12/2023 4:34:55 pm T: 01/12/2023 7:38:00 pm  JOB: [id number]/ [id number]

## 2023-01-12 NOTE — Brief Op Note (Addendum)
01/12/2023  3:57 PM  PATIENT:  Samantha Hurst  72 y.o. female  PRE-OPERATIVE DIAGNOSIS:  RIGHT UPPER LOBE NODULE  POST-OPERATIVE DIAGNOSIS:  GRANULOMA RIGHT UPPER LOBE   PROCEDURE:  XI ROBOTIC ASSISTED THORACOSCOPY- RIGHT UPPER LOBE WEDGE RESECTION  INTERCOSTAL NERVE BLOCK (Right) LYMPH NODE SAMPLING (Right)  SURGEON:  Surgeon(s) and Role:    [name], MD - Primary  PHYSICIAN ASSISTANT: Aloha Gell PA-C  ASSISTANTS: none   ANESTHESIA:   local and general  EBL:  50 mL   BLOOD ADMINISTERED:none  DRAINS:  Right pleural drain    LOCAL MEDICATIONS USED:  OTHER Exparel  SPECIMEN:  Source of Specimen:  RUL wedge, lymph nodes  DISPOSITION OF SPECIMEN:  PATHOLOGY  COUNTS:  YES  DICTATION: .done  PLAN OF CARE: Admit to inpatient   PATIENT DISPOSITION:  PACU - hemodynamically stable.   Delay start of Pharmacological VTE agent (>24hrs) due to surgical blood loss or risk of bleeding: no

## 2023-01-12 NOTE — Anesthesia Postprocedure Evaluation (Signed)
Anesthesia Post Note  Patient: Samantha Hurst  Procedure(s) Performed: XI ROBOTIC ASSISTED THORACOSCOPY- RIGHT UPPER LOBE WEDGE RESECTION (Right: Chest) INTERCOSTAL NERVE BLOCK (Right: Chest) LYMPH NODE DISSECTION (Right: Chest)     Patient location during evaluation: PACU Anesthesia Type: General Level of consciousness: awake and alert Pain management: pain level controlled Vital Signs Assessment: post-procedure vital signs reviewed and stable Respiratory status: spontaneous breathing, nonlabored ventilation and respiratory function stable Cardiovascular status: blood pressure returned to baseline and stable Postop Assessment: no apparent nausea or vomiting Anesthetic complications: no  No notable events documented.  Last Vitals:  Vitals:   01/12/23 1645 01/12/23 1700  BP: 125/70 (!) 141/76  Pulse: (!) 53 61  Resp: 15 14  Temp:  36.6 C  SpO2: 94% 96%    Last Pain:  Vitals:   01/12/23 1645  TempSrc:   PainSc: 0-No pain                 Letti Towell,W. EDMOND

## 2023-01-13 ENCOUNTER — Encounter (HOSPITAL_COMMUNITY): Payer: Self-pay | Admitting: Thoracic Surgery (Cardiothoracic Vascular Surgery)

## 2023-01-13 ENCOUNTER — Inpatient Hospital Stay (HOSPITAL_COMMUNITY): Payer: Medicare PPO

## 2023-01-13 LAB — BASIC METABOLIC PANEL
Anion gap: 10 (ref 5–15)
BUN: 13 mg/dL (ref 8–23)
CO2: 21 mmol/L — ABNORMAL LOW (ref 22–32)
Calcium: 8.2 mg/dL — ABNORMAL LOW (ref 8.9–10.3)
Chloride: 102 mmol/L (ref 98–111)
Creatinine, Ser: 0.93 mg/dL (ref 0.44–1.00)
GFR, Estimated: >60 mL/min (ref >60)
Glucose, Bld: 325 mg/dL — ABNORMAL HIGH (ref 70–99)
Potassium: 4.3 mmol/L (ref 3.5–5.1)
Sodium: 133 mmol/L — ABNORMAL LOW (ref 135–145)

## 2023-01-13 LAB — CBC
HCT: 36 % (ref 36.0–46.0)
Hemoglobin: 12 g/dL (ref 12.0–15.0)
MCH: 34.5 pg — ABNORMAL HIGH (ref 26.0–34.0)
MCHC: 33.3 g/dL (ref 30.0–36.0)
MCV: 103.4 fL — ABNORMAL HIGH (ref 80.0–100.0)
Platelets: 168 10*3/uL (ref 150–400)
RBC: 3.48 MIL/uL — ABNORMAL LOW (ref 3.87–5.11)
RDW: 13 % (ref 11.5–15.5)
WBC: 6 10*3/uL (ref 4.0–10.5)
nRBC: 0 % (ref 0.0–0.2)

## 2023-01-13 LAB — GLUCOSE, CAPILLARY: Glucose-Capillary: 130 mg/dL — ABNORMAL HIGH (ref 70–99)

## 2023-01-13 MED ORDER — ACETAMINOPHEN 500 MG PO TABS: 1000.0000 mg | ORAL_TABLET | Freq: Four times a day (QID) | ORAL | 0 refills | Status: AC

## 2023-01-13 MED ORDER — GABAPENTIN 300 MG PO CAPS: 300.0000 mg | ORAL_CAPSULE | Freq: Every day | ORAL | 1 refills | Status: AC

## 2023-01-13 MED ORDER — OXYCODONE HCL 5 MG PO TABS: 5.0000 mg | ORAL_TABLET | Freq: Four times a day (QID) | ORAL | 0 refills | Status: AC | PRN

## 2023-01-13 MED ORDER — XARELTO 20 MG PO TABS: 20.0000 mg | ORAL_TABLET | Freq: Every day | ORAL | Status: AC

## 2023-01-13 NOTE — Progress Notes (Addendum)
      [address]       [place] [address]             [phone]      1 Day Post-Op Procedure(s) (LRB): XI ROBOTIC ASSISTED THORACOSCOPY- RIGHT UPPER LOBE WEDGE RESECTION (Right) INTERCOSTAL NERVE BLOCK (Right) LYMPH NODE DISSECTION (Right) Subjective: Pt states she feels good this morning, has walked to the bathroom.  Objective: Vital signs in last 24 hours: Temp:  [97.9 F (36.6 C)-98.3 F (36.8 C)] 98.3 F (36.8 C) (07/09 0412) Pulse Rate:  [50-74] 52 (07/09 0412) Cardiac Rhythm: Sinus bradycardia (07/08 1900) Resp:  [10-19] 13 (07/09 0412) BP: (89-159)/(62-100) 101/70 (07/09 0412) SpO2:  [90 %-99 %] 93 % (07/09 0412) Arterial Line BP: (149-155)/(61-67) 153/65 (07/08 1645) Weight:  [79.4 kg] 79.4 kg (07/08 1041)  Hemodynamic parameters for last 24 hours:    Intake/Output from previous day: 07/08 0701 - 07/09 0700 In: 850 [I.V.:600; IV Piggyback:250] Out: 1750 [Urine:1450; Blood:50; Chest Tube:250] Intake/Output this shift: No intake/output data recorded.  General appearance: alert, cooperative, and no distress Neurologic: intact Heart: regular rate and rhythm-sinus bradycardia, no murmur Lungs: clear to auscultation bilaterally Abdomen: soft, non-tender; bowel sounds normal; no masses,  no organomegaly Extremities: edema trace Wound: Clean and dry dressing in place  Lab Results: Recent Labs    01/13/23 0010  WBC 6.0  HGB 12.0  HCT 36.0  PLT 168   BMET:  Recent Labs    01/13/23 0010  NA 133*  K 4.3  CL 102  CO2 21*  GLUCOSE 325*  BUN 13  CREATININE 0.93  CALCIUM 8.2*    PT/INR: No results for input(s): "LABPROT", "INR" in the last 72 hours. ABG    Component Value Date/Time   PHART 7.47 (H) 01/12/2023 1240   HCO3 23.3 01/12/2023 1240   O2SAT 99 01/12/2023 1240   CBG (last 3)  No results for input(s): "GLUCAP" in the last 72 hours.  Assessment/Plan: S/P Procedure(s) (LRB): XI ROBOTIC ASSISTED THORACOSCOPY- RIGHT  UPPER LOBE WEDGE RESECTION (Right) INTERCOSTAL NERVE BLOCK (Right) LYMPH NODE DISSECTION (Right)  Neuro: Pain controlled  CV: Soft blood pressure, SBP 89-101. NSR, HR 50s-70. Pt states her HR has been in the 50s-60s since her ablation. Will monitor. Will restart Xarelto at discharge.   Pulm: Saturating 90-95% on RA. CT output 250cc/24hrs. CXR with small right apical pneumothorax. No air leak seen with cough. CT to water seal.   GI: No nausea or vomiting  Endo: No hx of DM. Last glucose was 325, will get a finger stick. Hypothyroid, on Synthroid  Renal: Cr 0.93, good UO.   DVT Prophylaxis: Lovenox, ambulation  Dispo: CXR with right apical space, no air leak seen. D/C CT today vs tomorrow. Will discuss with Dr. Dorris Fetch   LOS: 1 day    Jenny Reichmann, PA-C 01/13/2023 Patient seen and examined, agree with above No air leak, only 250 ml drainage- dc chest tube Will ambulate and see how she does Possibly home later today Resume Xarelto on 01/15/2023  Viviann Spare C. Dorris Fetch, MD [facility] [phone]

## 2023-01-13 NOTE — Progress Notes (Signed)
Mobility Specialist Progress Note:   01/13/23 1115  Mobility  Activity Ambulated independently in hallway  Level of Assistance Independent  Assistive Device None  Distance Ambulated (ft) 500 ft  Activity Response Tolerated well  Mobility Referral Yes  $Mobility charge 1 Mobility  Mobility Specialist Start Time (ACUTE ONLY) 1048  Mobility Specialist Stop Time (ACUTE ONLY) 1112  Mobility Specialist Time Calculation (min) (ACUTE ONLY) 24 min    Pre Mobility: 60 HR Post Mobility:  78 HR  Pt received in bed, agreeable to mobility. Mod I for bed mobility. Ambulated in hallway independently w/ SV, no AD. Asymptomatic throughout. Pt left in bed with call bell and family present.  Samantha Hurst Mobility Specialist Please contact via Special educational needs teacher or Rehab office at [phone]

## 2023-01-13 NOTE — Discharge Summary (Addendum)
Physician Discharge Summary  Patient ID: GIONNA POLAK MRN: [id number] DOB/AGE: August 24, 1950 72 y.o.  Admit date: 01/12/2023 Discharge date: 01/13/2023  Admission Diagnoses: Right upper lobe pulmonary nodule  Discharge Diagnoses:  Principal Problem:   Right upper lobe pulmonary nodule Right upper lobe granuloma  Discharged Condition: stable  HPI: Mrs. Barnick is sent for cough patient regarding a right upper lobe lung nodule.   Hallie Chropening is a 72 year old woman with a history of paroxysmal atrial fibrillation, hyperlipidemia, hypothyroidism, and a recently diagnosed right upper lobe lung nodule.   She is a lifelong non-smoker.  Was found to have a right upper lobe lung nodule on a CT done prior to an atrial fibrillation ablation last fall. It showed a irregular shaped 17 x 8 mm nodule in the right upper lobe.  She recently had a follow-up CT which showed the nodule increased in size to 3.2 by 2.7 x 1.4 cm.  There was no mediastinal or hilar adenopathy.  There was a large hiatal hernia and also some aortic atherosclerosis.   She is not having any chest pain, pressure, tightness, or shortness of breath.  Denies palpitations.  Actually has been feeling rather well.  She has a chronic dry cough she has had for years but has not changed recently.  No change in appetite or weight loss.  She works out vigorously on a regular basis several times a week.  Dr. Dorris Fetch reviewed the patient's diagnostic studies and determined she would benefit from surgical intervention. He reviewed the patient's treatment options as well as the risks and benefits of surgery. Ms. Mccosh was agreeable to proceed with surgery.   Hospital Course: Ms. Kuch arrived at [facility] and was brought to the operating room on 01/12/23. She underwent robotic assisted right upper lobe wedge resection. She tolerated the procedure well, she was extubated and transferred to the PACU in stable  condition. CXR showed a small right apical pneumothorax but chest tube was without an air leak. Chest tube was removed without complication and follow up CXR showed a stable right apical space. Xarelto would be resumed on 01/15/23. She had an elevated glucose likely due to Dexamethasone, follow up CBG was 130 and glucose in 06/2022 was in the normal range. She was educated to continue close follow ups with her PCP. She was ambulating well and saturating well on room air. Her incisions were healing well without sign of infection. She was felt stable for discharge home.   Consults: None  Significant Diagnostic Studies:  CLINICAL DATA:  Encounter for chest tube removal.   EXAM: CHEST - 1 VIEW SAME DAY   COMPARISON:  Chest radiograph [id number] a.m., 01/12/2023, 01/09/2023   FINDINGS: Redemonstration of surgical suture within the superomedial right upper lobe. Persistent rounded opacity overlying the surgical sutures appears slightly decreased from prior. There is also a stable oval opacity overlying the right paratracheal border that is unchanged from prior and again may represent effusion or hematoma.   Interval removal of the previously seen superomedial right chest tube. Small right apical pneumothorax appears stable to slightly improved from prior. Mild subcutaneous air in the region of the inferolateral right chest is similar to prior.   Cardiac silhouette is at the upper limits of normal size.   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   IMPRESSION: 1. Interval removal of the previously seen superomedial right chest tube. 2. Small right apical pneumothorax appears stable to slightly improved from prior. 3. Persistent rounded opacity overlying the  surgical sutures within the right upper lung appears slightly decreased from prior. 4. Stable oval opacity overlying the right paratracheal border is unchanged from prior and again may represent postoperative hematoma  versus effusion.   Electronically Signed   By: Neita Garnet M.D.   On: 01/13/2023 16:09  Treatments: surgery:  Operative Report    DATE OF PROCEDURE: 01/12/2023   PREOPERATIVE DIAGNOSIS:  Right upper lobe lung nodule.   POSTOPERATIVE DIAGNOSIS:  Granuloma right upper lobe.   PROCEDURE:  Robotic-assisted right upper lobe wedge resection, lymph node sampling and intercostal nerve blocks levels 3 through 10.   SURGEON:  Salvatore Decent. Dorris Fetch, MD   PATHOLOGY: Pending...frozen showed granuloma of right upper lobe  Discharge Exam: Blood pressure 119/73, pulse (!) 59, temperature 97.6 F (36.4 C), temperature source Oral, resp. rate 15, height 5\' 8"  (1.727 m), weight 79.4 kg, SpO2 93 %. General appearance: alert, cooperative, and no distress Neurologic: intact Heart: regular rate and rhythm-sinus bradycardia, no murmur Lungs: clear to auscultation bilaterally Abdomen: soft, non-tender; bowel sounds normal; no masses,  no organomegaly Extremities: edema trace Wound: Clean and dry dressing in place  Disposition: Discharge disposition: 01-Home or Self Care        Allergies as of 01/13/2023   No Known Allergies      Medication List     TAKE these medications    acetaminophen 500 MG tablet Commonly known as: TYLENOL Take 2 tablets (1,000 mg total) by mouth every 6 (six) hours.   gabapentin 300 MG capsule Commonly known as: NEURONTIN Take 1 capsule (300 mg total) by mouth at bedtime.   levothyroxine 50 MCG tablet Commonly known as: SYNTHROID Take 50 mcg by mouth daily before breakfast.   Multi-Vitamin tablet Take 1 tablet by mouth daily.   omeprazole 40 MG capsule Commonly known as: PRILOSEC Take 40 mg by mouth daily.   oxyCODONE 5 MG immediate release tablet Commonly known as: Oxy IR/ROXICODONE Take 1 tablet (5 mg total) by mouth every 6 (six) hours as needed for moderate pain.   rosuvastatin 20 MG tablet Commonly known as: CRESTOR Take 20 mg by mouth  daily.   TURMERIC CURCUMIN PO Take 1 capsule by mouth daily.   Xarelto 20 MG Tabs tablet Generic drug: rivaroxaban Take 1 tablet (20 mg total) by mouth daily. Start taking on: January 15, 2023        Follow-up Information      IMAGING Follow up on 02/03/2023.   Why: To get chest xray at 3:45PM Contact information: [address] [place] [address]        [name], MD Follow up on 02/03/2023.   Specialty: Cardiothoracic Surgery Why: Follow up appointment is at 4:45PM Contact information: [address] [place] [address] [phone]         [facility] Follow up on 01/22/2023.   Specialty: Cardiothoracic Surgery Why: Nurse visit for suture removal is at 10:15AM Contact information: [address] [place], [address] [place] [address] [phone]                Signed: Jenny Reichmann, PA-C  01/14/2023, 8:31 AM

## 2023-01-13 NOTE — Inpatient Diabetes Management (Signed)
Inpatient Diabetes Program Recommendations  AACE/ADA: New Consensus Statement on Inpatient Glycemic Control (2015)  Target Ranges:  Prepandial:   less than 140 mg/dL      Peak postprandial:   less than 180 mg/dL (1-2 hours)      Critically ill patients:  140 - 180 mg/dL   Lab Results  Component Value Date   GLUCAP 89 12/05/2022    Latest Reference Range & Units 01/13/23 00:10  Glucose 70 - 99 mg/dL 161 (H)  (H): Data is abnormally high  Review of Glycemic Control  Diabetes history: No hx DM noted  Inpatient Diabetes Program Recommendations:    Noted lab glucose this am was 325. Please consider glycemic control order set with 0-9 units tid, 0-5 units hs  Thank you, Samantha Hurst. Kani Chauvin, RN, MSN, CDE  Diabetes Coordinator Inpatient Glycemic Control Team Team Pager [phone] (8am-5pm) 01/13/2023 1:08 PM

## 2023-01-13 NOTE — TOC CM/SW Note (Signed)
Transition of Care [facility]) - Inpatient Brief Assessment   Patient Details  Name: Samantha Hurst MRN: [id number] Date of Birth: 12/29/1950  Transition of Care [facility]) CM/SW Contact:    Harriet Masson, RN Phone Number: 01/13/2023, 12:58 PM   Clinical Narrative:               1 Day Post-Op Procedure(s) (LRB): XI ROBOTIC ASSISTED THORACOSCOPY- RIGHT UPPER LOBE WEDGE RESECTION. Chest tube removed today. TOC following.  Transition of Care Asessment: Insurance and Status: Insurance coverage has been reviewed Patient has primary care physician: Yes Home environment has been reviewed: safe to discharge home Prior level of function:: fine at home Prior/Current Home Services: No current home services Social Determinants of Health Reivew: SDOH reviewed no interventions necessary Readmission risk has been reviewed: Yes Transition of care needs: no transition of care needs at this time

## 2023-01-13 NOTE — Progress Notes (Signed)
Patient had discharge paper work gone over with, answered all questions, ivs removed, gave extra dressing supplies and wheel chaired out to husbands vehicle

## 2023-01-14 LAB — ACID FAST SMEAR (AFB, MYCOBACTERIA): Acid Fast Smear: NEGATIVE

## 2023-01-15 LAB — SURGICAL PATHOLOGY

## 2023-01-16 LAB — FUNGUS CULTURE WITH STAIN

## 2023-01-16 LAB — AEROBIC/ANAEROBIC CULTURE W GRAM STAIN (SURGICAL/DEEP WOUND)

## 2023-01-16 LAB — FUNGUS CULTURE RESULT

## 2023-01-17 LAB — AEROBIC/ANAEROBIC CULTURE W GRAM STAIN (SURGICAL/DEEP WOUND): Culture: NO GROWTH

## 2023-01-20 DIAGNOSIS — I480 Paroxysmal atrial fibrillation: Secondary | ICD-10-CM | POA: Diagnosis not present

## 2023-01-20 DIAGNOSIS — Z7901 Long term (current) use of anticoagulants: Secondary | ICD-10-CM | POA: Diagnosis not present

## 2023-01-22 ENCOUNTER — Ambulatory Visit: Payer: Self-pay

## 2023-01-22 DIAGNOSIS — Z4802 Encounter for removal of sutures: Secondary | ICD-10-CM

## 2023-01-22 NOTE — Progress Notes (Unsigned)
Patient arrived for nurse visit to remove suture/staples post- procedure RATS RUL Wedge with Dr. Dorris Fetch 01/12/23.  Suture/staples removed with no signs/ symptoms of infection noted.  Patient tolerated procedure well.  Patient/ family instructed to keep the incision sites clean and dry.  Patient/ family acknowledged instructions given.

## 2023-02-02 ENCOUNTER — Other Ambulatory Visit: Payer: Self-pay | Admitting: Thoracic Surgery (Cardiothoracic Vascular Surgery)

## 2023-02-02 DIAGNOSIS — R918 Other nonspecific abnormal finding of lung field: Secondary | ICD-10-CM

## 2023-02-02 NOTE — Progress Notes (Signed)
cxr 

## 2023-02-03 ENCOUNTER — Ambulatory Visit: Admission: RE | Admit: 2023-02-03 | Payer: Federal, State, Local not specified - PPO | Source: Ambulatory Visit

## 2023-02-03 ENCOUNTER — Ambulatory Visit (INDEPENDENT_AMBULATORY_CARE_PROVIDER_SITE_OTHER): Payer: Self-pay | Admitting: Thoracic Surgery (Cardiothoracic Vascular Surgery)

## 2023-02-03 VITALS — BP 144/89 | HR 77 | Resp 20 | Ht 68.0 in | Wt 171.0 lb

## 2023-02-03 DIAGNOSIS — R918 Other nonspecific abnormal finding of lung field: Secondary | ICD-10-CM

## 2023-02-03 DIAGNOSIS — Z09 Encounter for follow-up examination after completed treatment for conditions other than malignant neoplasm: Secondary | ICD-10-CM

## 2023-02-03 DIAGNOSIS — Z48813 Encounter for surgical aftercare following surgery on the respiratory system: Secondary | ICD-10-CM | POA: Diagnosis not present

## 2023-02-03 DIAGNOSIS — R911 Solitary pulmonary nodule: Secondary | ICD-10-CM

## 2023-02-03 NOTE — Progress Notes (Signed)
[address]       Samantha Hurst [address]             [phone]     HPI: Samantha Hurst returns for follow-up after recent wedge resection.  Samantha Hurst is a 72 year old woman with a history of paroxysmal atrial fibrillation, hyperlipidemia, hypothyroidism, and a right upper lobe lung nodule.  She is a lifelong non-smoker.  She was scheduled to have an atrial fibrillation ablation last fall.  Prior to that she had a CT of the chest which showed a 17 x 8 mm nodule in the right upper lobe.  A follow-up CT showed the nodule increased in size to 3.2 x 2.7 x 1.4 cm.  There was no mediastinal or hilar adenopathy.  On PET/CT the nodule is markedly hypermetabolic with an SUV of 6.  She underwent a robotic assisted right VATS for wedge resection on 01/12/2023.  Pathology showed caseating granuloma.  AFB and fungal stains were negative for organisms.  3 lymph nodes removed at the time of the procedure were all negative for granulomas.  She went home on postoperative day #1.  Since discharge she has been doing well.  She did not have any pain at all for about the first week, but then developed some paresthesias.  She has an unusual sensation of painful numbness along the right costal margin and some bulging in the right upper quadrant.  Currently taking gabapentin 300 mg p.o. nightly.  She has not taken any narcotics since discharge.  She does occasionally use Tylenol as needed.  Past Medical History:  Diagnosis Date   Arthritis    right ankle   Dysrhythmia    A. Fib   GERD (gastroesophageal reflux disease)    History of hiatal hernia    HLD (hyperlipidemia)    Hypothyroidism    Nodule of upper lobe of right lung    Paroxysmal A-fib (HCC)     Current Outpatient Medications  Medication Sig Dispense Refill   acetaminophen (TYLENOL) 500 MG tablet Take 2 tablets (1,000 mg total) by mouth every 6 (six) hours. 30 tablet 0   gabapentin (NEURONTIN) 300 MG capsule Take 1  capsule (300 mg total) by mouth at bedtime. 30 capsule 1   levothyroxine (SYNTHROID) 50 MCG tablet Take 50 mcg by mouth daily before breakfast.     Multiple Vitamin (MULTI-VITAMIN) tablet Take 1 tablet by mouth daily.     omeprazole (PRILOSEC) 40 MG capsule Take 40 mg by mouth daily.     oxyCODONE (OXY IR/ROXICODONE) 5 MG immediate release tablet Take 1 tablet (5 mg total) by mouth every 6 (six) hours as needed for moderate pain. 28 tablet 0   rosuvastatin (CRESTOR) 20 MG tablet Take 20 mg by mouth daily.     TURMERIC CURCUMIN PO Take 1 capsule by mouth daily.     XARELTO 20 MG TABS tablet Take 1 tablet (20 mg total) by mouth daily.     No current facility-administered medications for this visit.    Physical Exam BP (!) 144/89   Pulse 77   Resp 20   Ht 5\' 8"  (1.727 m)   Wt 171 lb (77.6 kg)   SpO2 97% Comment: RA  BMI 26.55 kg/m  72 year old woman in no acute distress Alert and oriented x 3 with no focal deficits Lungs clear bilaterally Incisions healing well Cardiac regular rate and rhythm No peripheral edema  Diagnostic Tests: CHEST - 2 VIEW   COMPARISON:  Chest radiograph 01/13/2023   FINDINGS: The cardiac silhouette is unchanged normal in size. A moderate-sized hiatal hernia is again noted. The lungs are well inflated. Postoperative changes are again noted, and densities projecting over sutures in the medial right upper lobe and along the right paratracheal border have decreased. The lungs are otherwise clear. The right apical pneumothorax on the prior study has resolved. There is no pleural effusion. No acute osseous abnormality is seen.   IMPRESSION: Evolving postoperative changes with resolved right pneumothorax. No new finding.     Electronically Signed   By: Sebastian Ache M.D.   On: 02/03/2023 16:31 I personally reviewed the chest x-ray images.  Postoperative changes.  Impression: Samantha Hurst is a 72 year old woman with a history of paroxysmal  atrial fibrillation, hyperlipidemia, hypothyroidism, and a right upper lobe lung nodule.  She is a lifelong non-smoker.  Right upper lobe lung nodule-caseating granuloma.  Consistent with infectious etiology.  AFB and fungal stains were negative.  Cultures are still pending.  Will await those.  As of now with no additional lesions noted, no indication for antimicrobial therapy.  Status post wedge resection-doing extremely well with minimal discomfort.  She does have some paresthesias.  We discussed increasing gabapentin to 300 mg p.o. twice daily but she does not want to do that at this point.  If her paresthesias worsen she will let us know and we can adjust her medication.  Not taking any narcotics.  There are no restrictions on her activities, but she was cautioned to build into new activities gradually.  Plan: I will plan to see her back in about 2 months with a PA and lateral chest x-ray to check on her progress  [name], MD [facility] [phone]

## 2023-03-11 DIAGNOSIS — H5211 Myopia, right eye: Secondary | ICD-10-CM | POA: Diagnosis not present

## 2023-03-11 DIAGNOSIS — H52223 Regular astigmatism, bilateral: Secondary | ICD-10-CM | POA: Diagnosis not present

## 2023-03-11 DIAGNOSIS — H5202 Hypermetropia, left eye: Secondary | ICD-10-CM | POA: Diagnosis not present

## 2023-04-06 ENCOUNTER — Other Ambulatory Visit: Payer: Self-pay | Admitting: Thoracic Surgery (Cardiothoracic Vascular Surgery)

## 2023-04-06 DIAGNOSIS — R911 Solitary pulmonary nodule: Secondary | ICD-10-CM

## 2023-04-07 ENCOUNTER — Ambulatory Visit (INDEPENDENT_AMBULATORY_CARE_PROVIDER_SITE_OTHER): Payer: Self-pay | Admitting: Thoracic Surgery (Cardiothoracic Vascular Surgery)

## 2023-04-07 ENCOUNTER — Ambulatory Visit
Admission: RE | Admit: 2023-04-07 | Discharge: 2023-04-07 | Disposition: A | Discharge status: Home / Self Care | Payer: Medicare PPO | Source: Ambulatory Visit | Attending: Thoracic Surgery (Cardiothoracic Vascular Surgery) | Admitting: Thoracic Surgery (Cardiothoracic Vascular Surgery)

## 2023-04-07 VITALS — BP 159/93 | HR 73 | Resp 18 | Ht 68.0 in | Wt 174.0 lb

## 2023-04-07 DIAGNOSIS — R911 Solitary pulmonary nodule: Secondary | ICD-10-CM

## 2023-04-07 DIAGNOSIS — Z48813 Encounter for surgical aftercare following surgery on the respiratory system: Secondary | ICD-10-CM | POA: Diagnosis not present

## 2023-04-07 DIAGNOSIS — Z09 Encounter for follow-up examination after completed treatment for conditions other than malignant neoplasm: Secondary | ICD-10-CM

## 2023-04-07 NOTE — Progress Notes (Signed)
[address]       [place] [address]             [phone]     HPI: Mrs. Hynes returns for a scheduled follow-up visit after wedge resection for granulomatous disease.  Samantha Hurst is a 72 year old woman with a history of paroxysmal atrial fibrillation, hyperlipidemia, hypothyroidism, and right upper lobe granuloma.  She is a lifelong non-smoker.  Found to have a lung nodule on a CT prior to an atrial fibrillation ablation.  On follow-up the nodule increased in size.  PET/CT showed the nodule was hypermetabolic.  I did a robotic assisted right upper lobe wedge resection on 01/12/2023.  Pathology showed caseating granuloma.  AFB and fungal stains were negative and cultures were all negative as well.  I saw her in late July.  She was having some paresthesias but otherwise was doing well.  She continues to do well.  She has not had any shortness of breath or wheezing.  She does have a chronic cough, which is probably related to her hiatal hernia.  That really has not changed since surgery.  Still has some unusual sensations from time to time but is not taking any pain medication.  Past Medical History:  Diagnosis Date   Arthritis    right ankle   Dysrhythmia    A. Fib   GERD (gastroesophageal reflux disease)    History of hiatal hernia    HLD (hyperlipidemia)    Hypothyroidism    Nodule of upper lobe of right lung    Paroxysmal A-fib (HCC)     Current Outpatient Medications  Medication Sig Dispense Refill   acetaminophen (TYLENOL) 500 MG tablet Take 2 tablets (1,000 mg total) by mouth every 6 (six) hours. 30 tablet 0   gabapentin (NEURONTIN) 300 MG capsule Take 1 capsule (300 mg total) by mouth at bedtime. 30 capsule 1   levothyroxine (SYNTHROID) 50 MCG tablet Take 50 mcg by mouth daily before breakfast.     Multiple Vitamin (MULTI-VITAMIN) tablet Take 1 tablet by mouth daily.     omeprazole (PRILOSEC) 40 MG capsule Take 40 mg by mouth daily.      oxyCODONE (OXY IR/ROXICODONE) 5 MG immediate release tablet Take 1 tablet (5 mg total) by mouth every 6 (six) hours as needed for moderate pain. 28 tablet 0   rosuvastatin (CRESTOR) 20 MG tablet Take 20 mg by mouth daily.     TURMERIC CURCUMIN PO Take 1 capsule by mouth daily.     XARELTO 20 MG TABS tablet Take 1 tablet (20 mg total) by mouth daily.     No current facility-administered medications for this visit.    Physical Exam BP (!) 159/93 (BP Location: Left Arm, Patient Position: Sitting)   Pulse 73   Resp 18   Ht 5\' 8"  (1.727 m)   Wt 174 lb (78.9 kg)   SpO2 99% Comment: RA  BMI 26.46 kg/m  Well-appearing 72 year old woman in no acute distress Alert and oriented x 3 with no focal deficits Lungs clear with equal breath sounds bilaterally Cardiac regular rate and rhythm normal S1 and S2 Incisions well-healed  Diagnostic Tests: Personally reviewed her chest x-ray.  No nodules, effusions, or infiltrates.  Impression: Samantha Hurst is a 72 year old woman with a history of paroxysmal atrial fibrillation, hyperlipidemia, hypothyroidism, and right upper lobe granuloma.    Granuloma right upper lobe-AFB and fungal stains and cultures were negative.  No other suspicious nodules.  Does  not need specific follow-up.  She asked about pulmonary function test scheduled for January.  She is a lifelong non-smoker and had normal PFTs prior to surgery.  Minimal lung tissue was resected.  There is no indication to do pulmonary function testing.  Hiatal hernia-she has a rather large hiatal hernia.  She does have a history of reflux but those symptoms are well-controlled.  Likely is the source of her cough.  Where she had have worsening symptoms, she would be a good candidate for surgical repair of her hiatal hernia.  Plan: I would be happy to see her back anytime in the future if I can be of any further assistance with her care  [name], MD [facility] [phone]

## 2023-04-22 DIAGNOSIS — M170 Bilateral primary osteoarthritis of knee: Secondary | ICD-10-CM | POA: Diagnosis not present

## 2023-04-22 DIAGNOSIS — M1711 Unilateral primary osteoarthritis, right knee: Secondary | ICD-10-CM | POA: Diagnosis not present

## 2023-04-22 DIAGNOSIS — M1712 Unilateral primary osteoarthritis, left knee: Secondary | ICD-10-CM | POA: Diagnosis not present

## 2023-06-10 DIAGNOSIS — H2511 Age-related nuclear cataract, right eye: Secondary | ICD-10-CM | POA: Diagnosis not present

## 2023-06-10 DIAGNOSIS — H25013 Cortical age-related cataract, bilateral: Secondary | ICD-10-CM | POA: Diagnosis not present

## 2023-06-10 DIAGNOSIS — H2513 Age-related nuclear cataract, bilateral: Secondary | ICD-10-CM | POA: Diagnosis not present

## 2023-06-10 DIAGNOSIS — H18413 Arcus senilis, bilateral: Secondary | ICD-10-CM | POA: Diagnosis not present

## 2023-06-10 DIAGNOSIS — H25043 Posterior subcapsular polar age-related cataract, bilateral: Secondary | ICD-10-CM | POA: Diagnosis not present

## 2023-07-09 ENCOUNTER — Ambulatory Visit: Payer: Medicare PPO | Admitting: Emergency Medicine

## 2023-07-15 ENCOUNTER — Ambulatory Visit: Payer: Medicare PPO | Admitting: Emergency Medicine

## 2023-08-04 DIAGNOSIS — E785 Hyperlipidemia, unspecified: Secondary | ICD-10-CM | POA: Diagnosis not present

## 2023-08-04 DIAGNOSIS — H2511 Age-related nuclear cataract, right eye: Secondary | ICD-10-CM | POA: Diagnosis not present

## 2023-08-04 DIAGNOSIS — H25042 Posterior subcapsular polar age-related cataract, left eye: Secondary | ICD-10-CM | POA: Diagnosis not present

## 2023-08-04 DIAGNOSIS — H268 Other specified cataract: Secondary | ICD-10-CM | POA: Diagnosis not present

## 2023-08-04 DIAGNOSIS — H25012 Cortical age-related cataract, left eye: Secondary | ICD-10-CM | POA: Diagnosis not present

## 2023-08-04 DIAGNOSIS — H2512 Age-related nuclear cataract, left eye: Secondary | ICD-10-CM | POA: Diagnosis not present

## 2023-08-11 DIAGNOSIS — H268 Other specified cataract: Secondary | ICD-10-CM | POA: Diagnosis not present

## 2023-08-11 DIAGNOSIS — H2512 Age-related nuclear cataract, left eye: Secondary | ICD-10-CM | POA: Diagnosis not present

## 2023-08-11 DIAGNOSIS — E039 Hypothyroidism, unspecified: Secondary | ICD-10-CM | POA: Diagnosis not present

## 2023-08-11 DIAGNOSIS — E785 Hyperlipidemia, unspecified: Secondary | ICD-10-CM | POA: Diagnosis not present

## 2023-08-11 DIAGNOSIS — I4891 Unspecified atrial fibrillation: Secondary | ICD-10-CM | POA: Diagnosis not present

## 2023-09-09 DIAGNOSIS — H04123 Dry eye syndrome of bilateral lacrimal glands: Secondary | ICD-10-CM | POA: Diagnosis not present

## 2023-10-21 DIAGNOSIS — E785 Hyperlipidemia, unspecified: Secondary | ICD-10-CM | POA: Diagnosis not present

## 2023-10-21 DIAGNOSIS — R739 Hyperglycemia, unspecified: Secondary | ICD-10-CM | POA: Diagnosis not present

## 2023-10-21 DIAGNOSIS — Z0000 Encounter for general adult medical examination without abnormal findings: Secondary | ICD-10-CM | POA: Diagnosis not present

## 2023-10-21 DIAGNOSIS — E7800 Pure hypercholesterolemia, unspecified: Secondary | ICD-10-CM | POA: Diagnosis not present

## 2023-11-05 DIAGNOSIS — E039 Hypothyroidism, unspecified: Secondary | ICD-10-CM | POA: Diagnosis not present

## 2023-11-05 DIAGNOSIS — Z0000 Encounter for general adult medical examination without abnormal findings: Secondary | ICD-10-CM | POA: Diagnosis not present

## 2023-11-05 DIAGNOSIS — K219 Gastro-esophageal reflux disease without esophagitis: Secondary | ICD-10-CM | POA: Diagnosis not present

## 2023-11-05 DIAGNOSIS — E785 Hyperlipidemia, unspecified: Secondary | ICD-10-CM | POA: Diagnosis not present

## 2023-11-05 DIAGNOSIS — I4891 Unspecified atrial fibrillation: Secondary | ICD-10-CM | POA: Diagnosis not present

## 2023-11-11 DIAGNOSIS — Z961 Presence of intraocular lens: Secondary | ICD-10-CM | POA: Diagnosis not present

## 2023-11-11 DIAGNOSIS — H04123 Dry eye syndrome of bilateral lacrimal glands: Secondary | ICD-10-CM | POA: Diagnosis not present

## 2023-11-11 DIAGNOSIS — H26491 Other secondary cataract, right eye: Secondary | ICD-10-CM | POA: Diagnosis not present

## 2023-11-11 DIAGNOSIS — H18413 Arcus senilis, bilateral: Secondary | ICD-10-CM | POA: Diagnosis not present

## 2023-11-18 DIAGNOSIS — H26492 Other secondary cataract, left eye: Secondary | ICD-10-CM | POA: Diagnosis not present

## 2023-12-07 DIAGNOSIS — M170 Bilateral primary osteoarthritis of knee: Secondary | ICD-10-CM | POA: Diagnosis not present

## 2023-12-07 DIAGNOSIS — I4891 Unspecified atrial fibrillation: Secondary | ICD-10-CM | POA: Diagnosis not present

## 2023-12-17 DIAGNOSIS — H02411 Mechanical ptosis of right eyelid: Secondary | ICD-10-CM | POA: Diagnosis not present

## 2023-12-17 DIAGNOSIS — H02412 Mechanical ptosis of left eyelid: Secondary | ICD-10-CM | POA: Diagnosis not present

## 2023-12-17 DIAGNOSIS — H0279 Other degenerative disorders of eyelid and periocular area: Secondary | ICD-10-CM | POA: Diagnosis not present

## 2023-12-17 DIAGNOSIS — H02834 Dermatochalasis of left upper eyelid: Secondary | ICD-10-CM | POA: Diagnosis not present

## 2023-12-17 DIAGNOSIS — H02831 Dermatochalasis of right upper eyelid: Secondary | ICD-10-CM | POA: Diagnosis not present

## 2023-12-17 DIAGNOSIS — H02423 Myogenic ptosis of bilateral eyelids: Secondary | ICD-10-CM | POA: Diagnosis not present

## 2023-12-17 DIAGNOSIS — H02413 Mechanical ptosis of bilateral eyelids: Secondary | ICD-10-CM | POA: Diagnosis not present

## 2023-12-17 DIAGNOSIS — H02422 Myogenic ptosis of left eyelid: Secondary | ICD-10-CM | POA: Diagnosis not present

## 2023-12-17 DIAGNOSIS — H02421 Myogenic ptosis of right eyelid: Secondary | ICD-10-CM | POA: Diagnosis not present

## 2024-02-02 DIAGNOSIS — Z01818 Encounter for other preprocedural examination: Secondary | ICD-10-CM | POA: Diagnosis not present

## 2024-02-02 DIAGNOSIS — N3000 Acute cystitis without hematuria: Secondary | ICD-10-CM | POA: Diagnosis not present

## 2024-02-08 DIAGNOSIS — H02831 Dermatochalasis of right upper eyelid: Secondary | ICD-10-CM | POA: Diagnosis not present

## 2024-02-08 DIAGNOSIS — H02423 Myogenic ptosis of bilateral eyelids: Secondary | ICD-10-CM | POA: Diagnosis not present

## 2024-02-08 DIAGNOSIS — H02412 Mechanical ptosis of left eyelid: Secondary | ICD-10-CM | POA: Diagnosis not present

## 2024-02-08 DIAGNOSIS — H02422 Myogenic ptosis of left eyelid: Secondary | ICD-10-CM | POA: Diagnosis not present

## 2024-02-08 DIAGNOSIS — H53453 Other localized visual field defect, bilateral: Secondary | ICD-10-CM | POA: Diagnosis not present

## 2024-02-08 DIAGNOSIS — H02413 Mechanical ptosis of bilateral eyelids: Secondary | ICD-10-CM | POA: Diagnosis not present

## 2024-02-08 DIAGNOSIS — H57813 Brow ptosis, bilateral: Secondary | ICD-10-CM | POA: Diagnosis not present

## 2024-02-08 DIAGNOSIS — H02834 Dermatochalasis of left upper eyelid: Secondary | ICD-10-CM | POA: Diagnosis not present

## 2024-02-08 DIAGNOSIS — H02411 Mechanical ptosis of right eyelid: Secondary | ICD-10-CM | POA: Diagnosis not present

## 2024-02-08 DIAGNOSIS — H02421 Myogenic ptosis of right eyelid: Secondary | ICD-10-CM | POA: Diagnosis not present

## 2024-02-11 DIAGNOSIS — Z7901 Long term (current) use of anticoagulants: Secondary | ICD-10-CM | POA: Diagnosis not present

## 2024-02-11 DIAGNOSIS — I498 Other specified cardiac arrhythmias: Secondary | ICD-10-CM | POA: Diagnosis not present

## 2024-02-11 DIAGNOSIS — I480 Paroxysmal atrial fibrillation: Secondary | ICD-10-CM | POA: Diagnosis not present

## 2024-03-14 DIAGNOSIS — I4891 Unspecified atrial fibrillation: Secondary | ICD-10-CM | POA: Diagnosis not present

## 2024-03-14 DIAGNOSIS — M1712 Unilateral primary osteoarthritis, left knee: Secondary | ICD-10-CM | POA: Diagnosis not present

## 2024-04-01 DIAGNOSIS — M25762 Osteophyte, left knee: Secondary | ICD-10-CM | POA: Diagnosis not present

## 2024-04-01 DIAGNOSIS — M170 Bilateral primary osteoarthritis of knee: Secondary | ICD-10-CM | POA: Diagnosis not present

## 2024-04-01 DIAGNOSIS — G8918 Other acute postprocedural pain: Secondary | ICD-10-CM | POA: Diagnosis not present

## 2024-04-01 DIAGNOSIS — Z96652 Presence of left artificial knee joint: Secondary | ICD-10-CM | POA: Diagnosis not present

## 2024-04-01 DIAGNOSIS — M1712 Unilateral primary osteoarthritis, left knee: Secondary | ICD-10-CM | POA: Diagnosis not present

## 2024-04-01 DIAGNOSIS — Z471 Aftercare following joint replacement surgery: Secondary | ICD-10-CM | POA: Diagnosis not present

## 2024-04-01 DIAGNOSIS — M25562 Pain in left knee: Secondary | ICD-10-CM | POA: Diagnosis not present

## 2024-04-04 DIAGNOSIS — Z96652 Presence of left artificial knee joint: Secondary | ICD-10-CM | POA: Diagnosis not present

## 2024-04-04 DIAGNOSIS — Z7409 Other reduced mobility: Secondary | ICD-10-CM | POA: Diagnosis not present

## 2024-04-04 DIAGNOSIS — R29898 Other symptoms and signs involving the musculoskeletal system: Secondary | ICD-10-CM | POA: Diagnosis not present

## 2024-04-04 DIAGNOSIS — Z9181 History of falling: Secondary | ICD-10-CM | POA: Diagnosis not present

## 2024-04-04 DIAGNOSIS — M25562 Pain in left knee: Secondary | ICD-10-CM | POA: Diagnosis not present

## 2024-04-04 DIAGNOSIS — M25662 Stiffness of left knee, not elsewhere classified: Secondary | ICD-10-CM | POA: Diagnosis not present

## 2024-04-11 DIAGNOSIS — R29898 Other symptoms and signs involving the musculoskeletal system: Secondary | ICD-10-CM | POA: Diagnosis not present

## 2024-04-11 DIAGNOSIS — Z96652 Presence of left artificial knee joint: Secondary | ICD-10-CM | POA: Diagnosis not present

## 2024-04-11 DIAGNOSIS — Z7409 Other reduced mobility: Secondary | ICD-10-CM | POA: Diagnosis not present

## 2024-04-11 DIAGNOSIS — Z9181 History of falling: Secondary | ICD-10-CM | POA: Diagnosis not present

## 2024-04-11 DIAGNOSIS — M25562 Pain in left knee: Secondary | ICD-10-CM | POA: Diagnosis not present

## 2024-04-11 DIAGNOSIS — M25662 Stiffness of left knee, not elsewhere classified: Secondary | ICD-10-CM | POA: Diagnosis not present

## 2024-04-18 DIAGNOSIS — Z7409 Other reduced mobility: Secondary | ICD-10-CM | POA: Diagnosis not present

## 2024-04-18 DIAGNOSIS — M25662 Stiffness of left knee, not elsewhere classified: Secondary | ICD-10-CM | POA: Diagnosis not present

## 2024-04-18 DIAGNOSIS — M25562 Pain in left knee: Secondary | ICD-10-CM | POA: Diagnosis not present

## 2024-04-18 DIAGNOSIS — Z96652 Presence of left artificial knee joint: Secondary | ICD-10-CM | POA: Diagnosis not present

## 2024-04-18 DIAGNOSIS — R29898 Other symptoms and signs involving the musculoskeletal system: Secondary | ICD-10-CM | POA: Diagnosis not present

## 2024-04-20 DIAGNOSIS — R29898 Other symptoms and signs involving the musculoskeletal system: Secondary | ICD-10-CM | POA: Diagnosis not present

## 2024-04-25 DIAGNOSIS — M25562 Pain in left knee: Secondary | ICD-10-CM | POA: Diagnosis not present

## 2024-04-25 DIAGNOSIS — M25662 Stiffness of left knee, not elsewhere classified: Secondary | ICD-10-CM | POA: Diagnosis not present

## 2024-04-25 DIAGNOSIS — Z96652 Presence of left artificial knee joint: Secondary | ICD-10-CM | POA: Diagnosis not present

## 2024-04-25 DIAGNOSIS — Z7409 Other reduced mobility: Secondary | ICD-10-CM | POA: Diagnosis not present

## 2024-04-25 DIAGNOSIS — Z9181 History of falling: Secondary | ICD-10-CM | POA: Diagnosis not present

## 2024-04-29 DIAGNOSIS — Z96652 Presence of left artificial knee joint: Secondary | ICD-10-CM | POA: Diagnosis not present

## 2024-04-29 DIAGNOSIS — R29898 Other symptoms and signs involving the musculoskeletal system: Secondary | ICD-10-CM | POA: Diagnosis not present

## 2024-04-29 DIAGNOSIS — M25562 Pain in left knee: Secondary | ICD-10-CM | POA: Diagnosis not present

## 2024-04-29 DIAGNOSIS — M25662 Stiffness of left knee, not elsewhere classified: Secondary | ICD-10-CM | POA: Diagnosis not present

## 2024-04-29 DIAGNOSIS — Z9181 History of falling: Secondary | ICD-10-CM | POA: Diagnosis not present

## 2024-05-02 DIAGNOSIS — Z96652 Presence of left artificial knee joint: Secondary | ICD-10-CM | POA: Diagnosis not present

## 2024-05-06 DIAGNOSIS — Z7409 Other reduced mobility: Secondary | ICD-10-CM | POA: Diagnosis not present

## 2024-05-10 DIAGNOSIS — Z7409 Other reduced mobility: Secondary | ICD-10-CM | POA: Diagnosis not present

## 2024-05-10 DIAGNOSIS — M25562 Pain in left knee: Secondary | ICD-10-CM | POA: Diagnosis not present

## 2024-05-10 DIAGNOSIS — Z9181 History of falling: Secondary | ICD-10-CM | POA: Diagnosis not present

## 2024-05-10 DIAGNOSIS — M25662 Stiffness of left knee, not elsewhere classified: Secondary | ICD-10-CM | POA: Diagnosis not present

## 2024-05-10 DIAGNOSIS — Z96652 Presence of left artificial knee joint: Secondary | ICD-10-CM | POA: Diagnosis not present

## 2024-05-10 DIAGNOSIS — R29898 Other symptoms and signs involving the musculoskeletal system: Secondary | ICD-10-CM | POA: Diagnosis not present

## 2024-05-17 DIAGNOSIS — Z7409 Other reduced mobility: Secondary | ICD-10-CM | POA: Diagnosis not present

## 2024-05-30 DIAGNOSIS — Z9181 History of falling: Secondary | ICD-10-CM | POA: Diagnosis not present

## 2024-05-30 DIAGNOSIS — R29898 Other symptoms and signs involving the musculoskeletal system: Secondary | ICD-10-CM | POA: Diagnosis not present

## 2024-05-30 DIAGNOSIS — M25562 Pain in left knee: Secondary | ICD-10-CM | POA: Diagnosis not present

## 2024-05-30 DIAGNOSIS — Z7409 Other reduced mobility: Secondary | ICD-10-CM | POA: Diagnosis not present

## 2024-05-30 DIAGNOSIS — M25662 Stiffness of left knee, not elsewhere classified: Secondary | ICD-10-CM | POA: Diagnosis not present
